# Patient Record
Sex: Female | Born: 1935 | Race: White | Hispanic: No | Smoking: Never smoker
Health system: Southern US, Community
[De-identification: ages and names within clinical notes are randomized; demographics above are authoritative.]

## PROBLEM LIST (undated history)

## (undated) DIAGNOSIS — D509 Iron deficiency anemia, unspecified: Secondary | ICD-10-CM

## (undated) DIAGNOSIS — K5790 Diverticulosis of intestine, part unspecified, without perforation or abscess without bleeding: Secondary | ICD-10-CM

## (undated) DIAGNOSIS — A4902 Methicillin resistant Staphylococcus aureus infection, unspecified site: Secondary | ICD-10-CM

## (undated) DIAGNOSIS — I2510 Atherosclerotic heart disease of native coronary artery without angina pectoris: Secondary | ICD-10-CM

## (undated) DIAGNOSIS — E039 Hypothyroidism, unspecified: Secondary | ICD-10-CM

## (undated) DIAGNOSIS — I10 Essential (primary) hypertension: Secondary | ICD-10-CM

## (undated) DIAGNOSIS — K5190 Ulcerative colitis, unspecified, without complications: Secondary | ICD-10-CM

## (undated) DIAGNOSIS — I495 Sick sinus syndrome: Secondary | ICD-10-CM

## (undated) DIAGNOSIS — I4589 Other specified conduction disorders: Secondary | ICD-10-CM

## (undated) DIAGNOSIS — E785 Hyperlipidemia, unspecified: Secondary | ICD-10-CM

## (undated) HISTORY — DX: Iron deficiency anemia, unspecified: D50.9

## (undated) HISTORY — DX: Methicillin resistant Staphylococcus aureus infection, unspecified site: A49.02

## (undated) HISTORY — DX: Diverticulosis of intestine, part unspecified, without perforation or abscess without bleeding: K57.90

## (undated) HISTORY — DX: Sick sinus syndrome: I49.5

## (undated) HISTORY — DX: Hyperlipidemia, unspecified: E78.5

## (undated) HISTORY — DX: Other specified conduction disorders: I45.89

## (undated) HISTORY — DX: Ulcerative colitis, unspecified, without complications: K51.90

## (undated) HISTORY — DX: Atherosclerotic heart disease of native coronary artery without angina pectoris: I25.10

## (undated) HISTORY — PX: THYROIDECTOMY, PARTIAL: SHX18

## (undated) HISTORY — DX: Essential (primary) hypertension: I10

## (undated) HISTORY — DX: Hypothyroidism, unspecified: E03.9

---

## 2004-02-04 HISTORY — PX: CARDIAC CATHETERIZATION: SHX172

## 2004-02-05 ENCOUNTER — Inpatient Hospital Stay (HOSPITAL_COMMUNITY): Admission: RE | Admit: 2004-02-05 | Discharge: 2004-02-07 | Payer: Self-pay | Admitting: Internal Medicine

## 2004-02-06 HISTORY — PX: PACEMAKER INSERTION: SHX728

## 2004-04-14 ENCOUNTER — Ambulatory Visit: Payer: Self-pay | Admitting: Family Medicine

## 2004-05-20 ENCOUNTER — Ambulatory Visit: Payer: Self-pay | Admitting: Internal Medicine

## 2004-07-21 ENCOUNTER — Ambulatory Visit: Payer: Self-pay | Admitting: Family Medicine

## 2004-10-20 ENCOUNTER — Ambulatory Visit: Payer: Self-pay | Admitting: Family Medicine

## 2004-11-17 ENCOUNTER — Ambulatory Visit: Payer: Self-pay | Admitting: Internal Medicine

## 2004-12-01 ENCOUNTER — Ambulatory Visit: Payer: Self-pay | Admitting: Family Medicine

## 2005-03-30 ENCOUNTER — Ambulatory Visit: Payer: Self-pay | Admitting: Family Medicine

## 2005-06-03 ENCOUNTER — Ambulatory Visit: Payer: Self-pay

## 2005-08-10 ENCOUNTER — Ambulatory Visit: Payer: Self-pay | Admitting: Family Medicine

## 2005-09-06 ENCOUNTER — Ambulatory Visit: Payer: Self-pay | Admitting: Internal Medicine

## 2005-12-06 ENCOUNTER — Ambulatory Visit: Payer: Self-pay | Admitting: Internal Medicine

## 2006-02-08 ENCOUNTER — Ambulatory Visit: Payer: Self-pay | Admitting: Family Medicine

## 2006-05-03 ENCOUNTER — Ambulatory Visit: Payer: Self-pay | Admitting: Internal Medicine

## 2006-05-26 ENCOUNTER — Ambulatory Visit: Payer: Self-pay

## 2006-06-21 ENCOUNTER — Ambulatory Visit: Payer: Self-pay | Admitting: Family Medicine

## 2006-06-28 ENCOUNTER — Ambulatory Visit: Payer: Self-pay | Admitting: Internal Medicine

## 2006-07-18 ENCOUNTER — Ambulatory Visit: Payer: Self-pay | Admitting: Family Medicine

## 2006-08-23 ENCOUNTER — Ambulatory Visit: Payer: Self-pay | Admitting: Internal Medicine

## 2006-09-13 ENCOUNTER — Ambulatory Visit: Payer: Self-pay | Admitting: Family Medicine

## 2006-10-18 ENCOUNTER — Ambulatory Visit: Payer: Self-pay | Admitting: Internal Medicine

## 2006-10-27 ENCOUNTER — Ambulatory Visit: Payer: Self-pay | Admitting: Family Medicine

## 2006-11-29 ENCOUNTER — Ambulatory Visit: Payer: Self-pay | Admitting: Family Medicine

## 2006-12-13 ENCOUNTER — Ambulatory Visit: Payer: Self-pay | Admitting: Internal Medicine

## 2007-01-10 ENCOUNTER — Ambulatory Visit: Payer: Self-pay | Admitting: Internal Medicine

## 2007-02-16 ENCOUNTER — Ambulatory Visit: Payer: Self-pay

## 2007-04-04 ENCOUNTER — Ambulatory Visit: Payer: Self-pay | Admitting: Internal Medicine

## 2007-04-06 ENCOUNTER — Encounter (HOSPITAL_BASED_OUTPATIENT_CLINIC_OR_DEPARTMENT_OTHER): Admission: RE | Admit: 2007-04-06 | Discharge: 2007-04-24 | Payer: Self-pay | Admitting: Surgery

## 2007-04-12 ENCOUNTER — Inpatient Hospital Stay (HOSPITAL_COMMUNITY): Admission: AD | Admit: 2007-04-12 | Discharge: 2007-04-17 | Payer: Self-pay | Admitting: Internal Medicine

## 2007-04-12 ENCOUNTER — Ambulatory Visit: Payer: Self-pay | Admitting: Infectious Diseases

## 2007-04-12 ENCOUNTER — Ambulatory Visit: Payer: Self-pay | Admitting: Internal Medicine

## 2007-04-12 ENCOUNTER — Ambulatory Visit: Payer: Self-pay | Admitting: Cardiology

## 2007-04-13 ENCOUNTER — Encounter: Payer: Self-pay | Admitting: Internal Medicine

## 2007-04-14 ENCOUNTER — Encounter: Payer: Self-pay | Admitting: Cardiology

## 2007-05-02 ENCOUNTER — Ambulatory Visit: Payer: Self-pay | Admitting: Internal Medicine

## 2007-05-18 ENCOUNTER — Ambulatory Visit: Payer: Self-pay | Admitting: Internal Medicine

## 2007-05-30 ENCOUNTER — Ambulatory Visit: Payer: Self-pay | Admitting: Internal Medicine

## 2007-06-27 ENCOUNTER — Ambulatory Visit: Payer: Self-pay | Admitting: Internal Medicine

## 2007-10-17 ENCOUNTER — Ambulatory Visit: Payer: Self-pay | Admitting: Internal Medicine

## 2008-01-16 ENCOUNTER — Ambulatory Visit: Payer: Self-pay | Admitting: Internal Medicine

## 2008-01-30 ENCOUNTER — Ambulatory Visit: Payer: Self-pay | Admitting: Cardiology

## 2008-02-13 ENCOUNTER — Ambulatory Visit: Payer: Self-pay | Admitting: Internal Medicine

## 2008-02-13 ENCOUNTER — Ambulatory Visit: Payer: Self-pay | Admitting: Cardiology

## 2008-02-13 LAB — CONVERTED CEMR LAB
Basophils Absolute: 0 10*3/uL (ref 0.0–0.1)
CO2: 29 meq/L (ref 19–32)
Calcium: 9.7 mg/dL (ref 8.4–10.5)
Creatinine, Ser: 1.1 mg/dL (ref 0.4–1.2)
Eosinophils Absolute: 0.2 10*3/uL (ref 0.0–0.7)
Glucose, Bld: 100 mg/dL — ABNORMAL HIGH (ref 70–99)
Neutrophils Relative %: 67.9 % (ref 43.0–77.0)
RDW: 12.7 % (ref 11.5–14.6)
Sodium: 142 meq/L (ref 135–145)
WBC: 5.9 10*3/uL (ref 4.5–10.5)

## 2008-02-15 ENCOUNTER — Inpatient Hospital Stay (HOSPITAL_BASED_OUTPATIENT_CLINIC_OR_DEPARTMENT_OTHER): Admission: RE | Admit: 2008-02-15 | Discharge: 2008-02-15 | Payer: Self-pay | Admitting: Cardiology

## 2008-02-15 ENCOUNTER — Ambulatory Visit: Payer: Self-pay | Admitting: Cardiology

## 2008-03-04 ENCOUNTER — Ambulatory Visit: Payer: Self-pay | Admitting: Cardiology

## 2008-04-11 ENCOUNTER — Ambulatory Visit: Payer: Self-pay | Admitting: Cardiology

## 2008-04-16 ENCOUNTER — Ambulatory Visit: Payer: Self-pay | Admitting: Internal Medicine

## 2008-05-21 DIAGNOSIS — I10 Essential (primary) hypertension: Secondary | ICD-10-CM

## 2008-05-21 DIAGNOSIS — D509 Iron deficiency anemia, unspecified: Secondary | ICD-10-CM | POA: Insufficient documentation

## 2008-05-21 DIAGNOSIS — I495 Sick sinus syndrome: Secondary | ICD-10-CM

## 2008-05-21 DIAGNOSIS — Z950 Presence of cardiac pacemaker: Secondary | ICD-10-CM | POA: Insufficient documentation

## 2008-05-21 DIAGNOSIS — E039 Hypothyroidism, unspecified: Secondary | ICD-10-CM | POA: Insufficient documentation

## 2008-05-21 DIAGNOSIS — I2510 Atherosclerotic heart disease of native coronary artery without angina pectoris: Secondary | ICD-10-CM

## 2008-05-21 DIAGNOSIS — A4902 Methicillin resistant Staphylococcus aureus infection, unspecified site: Secondary | ICD-10-CM | POA: Insufficient documentation

## 2008-05-21 DIAGNOSIS — E785 Hyperlipidemia, unspecified: Secondary | ICD-10-CM

## 2008-05-22 ENCOUNTER — Ambulatory Visit: Payer: Self-pay | Admitting: Cardiology

## 2008-08-12 ENCOUNTER — Encounter: Payer: Self-pay | Admitting: Internal Medicine

## 2008-08-13 ENCOUNTER — Ambulatory Visit: Payer: Self-pay | Admitting: Internal Medicine

## 2008-10-01 ENCOUNTER — Ambulatory Visit: Payer: Self-pay | Admitting: Cardiology

## 2008-11-18 ENCOUNTER — Ambulatory Visit: Payer: Self-pay | Admitting: Internal Medicine

## 2009-01-14 IMAGING — CR DG CHEST 1V PORT
1 series · 1 of 1 positions shown · non-contrast
Comparison: 02/07/04.

CLINICAL DATA: Question pacemaker.  Endocarditis.  
 PORTABLE CHEST ([DATE] HOURS):

[view not recorded]
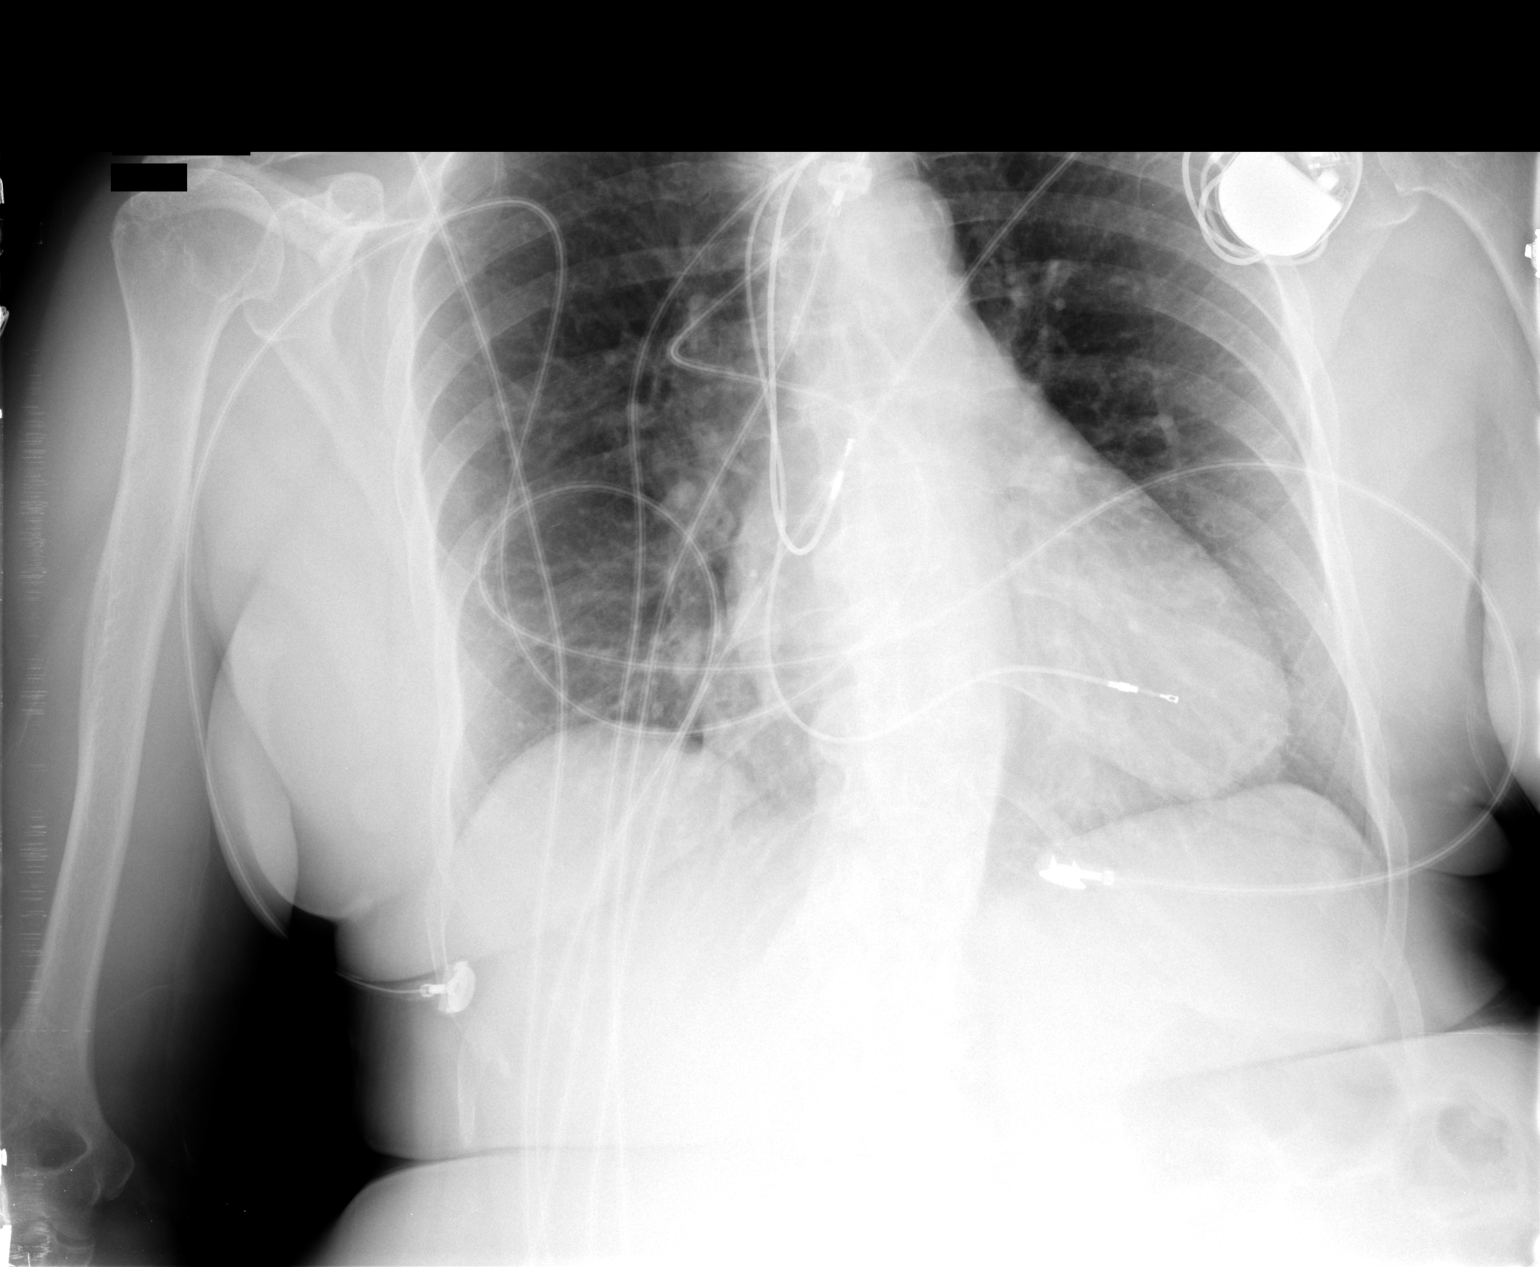

[1 of 1 positions shown; findings below may reference images not displayed]

FINDINGS: Dual lead pacemaker is unchanged from the prior study.  The heart is mildly enlarged.  There is no heart failure, infiltrate, or effusion.
IMPRESSION: No active cardiopulmonary disease.

## 2009-02-17 ENCOUNTER — Ambulatory Visit: Payer: Self-pay | Admitting: Internal Medicine

## 2009-02-20 ENCOUNTER — Encounter (INDEPENDENT_AMBULATORY_CARE_PROVIDER_SITE_OTHER): Payer: Self-pay | Admitting: *Deleted

## 2009-03-05 ENCOUNTER — Ambulatory Visit: Payer: Self-pay

## 2009-03-05 ENCOUNTER — Encounter: Payer: Self-pay | Admitting: Internal Medicine

## 2009-05-19 ENCOUNTER — Ambulatory Visit: Payer: Self-pay | Admitting: Internal Medicine

## 2009-08-18 ENCOUNTER — Ambulatory Visit: Payer: Self-pay | Admitting: Internal Medicine

## 2009-08-26 ENCOUNTER — Ambulatory Visit: Payer: Self-pay | Admitting: Internal Medicine

## 2009-11-17 ENCOUNTER — Encounter: Payer: Self-pay | Admitting: Internal Medicine

## 2009-11-28 ENCOUNTER — Ambulatory Visit: Payer: Self-pay | Admitting: Internal Medicine

## 2010-02-13 ENCOUNTER — Ambulatory Visit: Payer: Self-pay | Admitting: Internal Medicine

## 2010-02-16 ENCOUNTER — Encounter: Payer: Self-pay | Admitting: Internal Medicine

## 2010-05-18 ENCOUNTER — Ambulatory Visit: Payer: Self-pay | Admitting: Internal Medicine

## 2010-07-02 ENCOUNTER — Encounter (INDEPENDENT_AMBULATORY_CARE_PROVIDER_SITE_OTHER): Payer: Self-pay | Admitting: *Deleted

## 2010-07-09 NOTE — Cardiovascular Report (Signed)
Summary: TTM   TTM   Imported By: Sallee Provencal 12/15/2009 14:47:29  _____________________________________________________________________  External Attachment:    Type:   Image     Comment:   External Document

## 2010-07-09 NOTE — Cardiovascular Report (Signed)
Summary: TTM   TTM   Imported By: Sallee Provencal 06/27/2009 14:57:07  _____________________________________________________________________  External Attachment:    Type:   Image     Comment:   External Document

## 2010-07-09 NOTE — Assessment & Plan Note (Signed)
Summary: per check out/sf   Primary Provider:  Edrick Oh, MD  CC:  pt has concerns over new medication given for cholesterol.  Vimovo rx.  Pt does not want to take medication given her hx of ulcerative colitis.  Needs input on whether she should take this medication or not.Marland Kitchen  History of Present Illness: Ms. Paula Walls is seen following pacemaker implantation for sinus node dysfunction.  She has no intrinsic atrial rhythm.   She has normal left ventricular function; by echo in 2008 and catheterization 2009 demonstrated moderate diffuse disease also with normal ejection fraction.  Her major complaint is cramping in her thighs bilaterally periodicity going on and off for more than a year. Discontinuation of her status was associated with some but incomplete relief.  She was given by Dr. Edrick Oh and prescription for Naprosyn omeprazole combination.   Current Medications (verified): 1)  Diovan Hct 160-12.5 Mg Tabs (Valsartan-Hydrochlorothiazide) .Marland Kitchen.. 1 Tablet By Mouth Once A Day 2)  Levoxyl 100 Mcg Tabs (Levothyroxine Sodium) .Marland Kitchen.. 1 Once Daily 3)  Metoprolol Succinate 50 Mg Xr24h-Tab (Metoprolol Succinate) .... Take One Two Times A Day 4)  Folic Acid 1 Mg Tabs (Folic Acid) .Marland Kitchen.. 1 Once Daily 5)  Sulfasalazine 500 Mg Tabs (Sulfasalazine) .Marland Kitchen.. 1 Once Daily  Allergies (verified): 1)  ! Flagyl  Past History:  Past Medical History: Last updated: 05/21/2008 SINOATRIAL NODE DYSFUNCTION (ICD-427.81) PACEMAKER, PERMANENT (ICD-V45.01) MRSA (ICD-041.12) UNSPECIFIED IRON DEFICIENCY ANEMIA (ICD-280.9) UNSPECIFIED HYPOTHYROIDISM (ICD-244.9) HYPERTENSION, UNSPECIFIED (ICD-401.9) HYPERLIPIDEMIA-MIXED (ICD-272.4) CAD, NATIVE VESSEL (ICD-414.01)  Past Surgical History: Last updated: 05/21/2008 Cardiac Catheterization 02/04/2004 Permanent pacemaker St. Jude 02/06/2004 Partial thyroidectomy   Family History: Last updated: 05/21/2008 Remakable for father who died at 68, mother at 46, and a brother at 64.  Mother had a history of transient ischemic attacks and possible heart attack.  Father she feels that it was possibly a hear attack, although both of their history is questionable, as they did not see physicians.    Social History: Last updated: 05/21/2008 Widowed  Tobacco Use - No.  Part Time -works in [employer] Signs:  Patient profile:   77 year old female Height:      62 inches Weight:      126 pounds BMI:     23.13 Pulse rate:   60 / minute Pulse rhythm:   regular BP sitting:   134 / 75  (left arm)  Vitals Entered By: Doug Sou CMA (August 26, 2009 11:42 AM)  Physical Exam  General:  The patient was alert and oriented in no acute distress. HEENT Normal.  Neck veins were flat, carotids were brisk.  Lungs were clear.  Heart sounds were regular without murmurs or gallops.  Abdomen was soft with active bowel sounds. There is no clubbing cyanosis or edema. Skin Warm and dry    PPM Specifications Following MD:  Virl Axe, MD     PPM Vendor:  St Jude     PPM Model Number:  [id number]     PPM Serial Number:  [id number] DOI:  02/06/2004     PPM Implanting MD:  Virl Axe, MD  Lead 1    Location: RA     DOI: 02/06/2004     Model #: [id number]     Serial #: [id number]     Status: active Lead 2    Location: RV     DOI: 02/06/2004     Model #: [id number]     Serial #: [id number]  Status: active  Magnet Response Rate:  BOL 98.6 ERI  86.3  Indications:  Sinus node dysfunction  Explantation Comments:  TTM's with [employer]  PPM Follow Up Remote Check?  No Battery Voltage:  2.76 V     Battery Est. Longevity:  5.75 years     Pacer Dependent:  Yes       PPM Device Measurements Atrium  Impedance: 505 ohms, Threshold: 0.5 V at 0.4 msec Right Ventricle  Amplitude: 16.6 mV, Impedance: 400 ohms, Threshold: 0.875 V at 0.4 msec  Episodes MS Episodes:  0     Percent Mode Switch:  0     Coumadin:  No Atrial Pacing:  100%     Ventricular Pacing:  <1%  Parameters Mode:  DDDR     Lower  Rate Limit:  60     Upper Rate Limit:  110 Paced AV Delay:  250     Sensed AV Delay:  225 Next Cardiology Appt Due:  02/05/2010 Tech Comments:  No parameter changes.   Device function normal.  ROV 6 months clinic. Alma Friendly, LPN  August 27, 9242 62:86 AM   Impression & Recommendations:  Problem # 1:  SINOATRIAL NODE DYSFUNCTION (ICD-427.81) 100% paced via her device Her updated medication list for this problem includes:    Metoprolol Succinate 50 Mg Xr24h-tab (Metoprolol succinate) .Marland Kitchen... Take one two times a day  Problem # 2:  PACEMAKER, PERMANENT (ICD-V45.01) Device parameters and data were reviewed and no changes were made  Problem # 3:  HYPERLIPIDEMIA-MIXED (ICD-272.4) she is currently not on statin therapy. I suggested that she try red yeast rice. Also as relates to these leg cramps I wonder whether this could be an unusual manifestation of polymyalgia rheumatica. She is to see Dr. Edrick Oh tomorrow. I've given her a note asking that question.it would certainly be atypical The following medications were removed from the medication list:    Lipitor 20 Mg Tabs (Atorvastatin calcium) .Marland Kitchen... Take one tablet by mouth daily.

## 2010-07-09 NOTE — Cardiovascular Report (Signed)
Summary: Office Visit   Office Visit   Imported By: Sallee Provencal 08/28/2009 10:09:17  _____________________________________________________________________  External Attachment:    Type:   Image     Comment:   External Document

## 2010-07-09 NOTE — Cardiovascular Report (Signed)
Summary: TTM   TTM   Imported By: Sallee Provencal 06/05/2010 11:43:23  _____________________________________________________________________  External Attachment:    Type:   Image     Comment:   External Document

## 2010-07-09 NOTE — Cardiovascular Report (Signed)
Summary: TTM   TTM   Imported By: Sallee Provencal 03/18/2010 16:09:50  _____________________________________________________________________  External Attachment:    Type:   Image     Comment:   External Document

## 2010-07-09 NOTE — Cardiovascular Report (Signed)
Summary: TTM   TTM   Imported By: Sallee Provencal 08/27/2009 16:15:20  _____________________________________________________________________  External Attachment:    Type:   Image     Comment:   External Document

## 2010-07-09 NOTE — Letter (Signed)
Summary: Device-Delinquent Check  [facility] [address] [place]   [place], [place] [address]   Phone: [phone]  Fax: [phone]     July 02, 2010 MRN: [id number]   Paula Walls [address] [place], [place]  [address]   Dear Ms. Huynh,  According to our records, you have not had your implanted device checked in the recommended period of time.  We are unable to determine appropriate device function without checking your device on a regular basis.  Please call our office to schedule an appointment, with Dr Caryl Comes,  as soon as possible.  If you are having your device checked by another physician, please call us so that we may update our records.  Thank you,  Gasper Sells, EMT  July 02, 2010 12:48 PM  [facility] certified

## 2010-07-23 ENCOUNTER — Encounter (INDEPENDENT_AMBULATORY_CARE_PROVIDER_SITE_OTHER): Payer: Medicare Other | Admitting: Internal Medicine

## 2010-07-23 ENCOUNTER — Encounter: Payer: Self-pay | Admitting: Internal Medicine

## 2010-07-23 DIAGNOSIS — I10 Essential (primary) hypertension: Secondary | ICD-10-CM

## 2010-07-23 DIAGNOSIS — I498 Other specified cardiac arrhythmias: Secondary | ICD-10-CM

## 2010-07-23 DIAGNOSIS — Z950 Presence of cardiac pacemaker: Secondary | ICD-10-CM

## 2010-07-24 ENCOUNTER — Encounter: Payer: Self-pay | Admitting: Internal Medicine

## 2010-08-04 NOTE — Cardiovascular Report (Signed)
Summary: Certified Letter Signed - Patient (not doing f/u, pt put on reca  Certified Letter Signed - Patient (not doing f/u, pt put on recall list)   Imported By: Ranell Patrick 07/29/2010 16:07:55  _____________________________________________________________________  External Attachment:    Type:   Image     Comment:   External Document

## 2010-08-04 NOTE — Cardiovascular Report (Signed)
Summary: Office Visit   Office Visit   Imported By: Sallee Provencal 07/29/2010 16:17:12  _____________________________________________________________________  External Attachment:    Type:   Image     Comment:   External Document

## 2010-08-04 NOTE — Assessment & Plan Note (Signed)
Summary: pacer;phys ck/mt   Visit Type:  pacer check Primary Provider:  Edrick Oh, MD   History of Present Illness: Paula Walls is seen following pacemaker implantation for sinus node dysfunction.  She has no intrinsic atrial rhythm.   She has normal left ventricular function; by echo in 2008 and catheterization 2009 demonstrated moderate diffuse disease also with normal ejection fraction.  She recently had a problem with a sub-conjunctiva hemorrhage which was quite disconcerting The patient denies SOB, chest pain, edema or palpitations      Current Medications (verified): 1)  Diovan Hct 160-12.5 Mg Tabs (Valsartan-Hydrochlorothiazide) .Marland Kitchen.. 1 Tablet By Mouth Once A Day 2)  Levoxyl 100 Mcg Tabs (Levothyroxine Sodium) .Marland Kitchen.. 1 Once Daily 3)  Metoprolol Succinate 50 Mg Xr24h-Tab (Metoprolol Succinate) .... Take 1 Tablet By Mouth Once A Day 4)  Folic Acid 1 Mg Tabs (Folic Acid) .Marland Kitchen.. 1 Once Daily  Allergies: 1)  ! Flagyl  Past History:  Past Medical History: Last updated: 05/21/2008 SINOATRIAL NODE DYSFUNCTION (ICD-427.81) PACEMAKER, PERMANENT (ICD-V45.01) MRSA (ICD-041.12) UNSPECIFIED IRON DEFICIENCY ANEMIA (ICD-280.9) UNSPECIFIED HYPOTHYROIDISM (ICD-244.9) HYPERTENSION, UNSPECIFIED (ICD-401.9) HYPERLIPIDEMIA-MIXED (ICD-272.4) CAD, NATIVE VESSEL (ICD-414.01)  Family History: Reviewed history from 05/21/2008 and no changes required. Remakable for father who died at 4, mother at 35, and a brother at 68. Mother had a history of transient ischemic attacks and possible heart attack.  Father she feels that it was possibly a hear attack, although both of their history is questionable, as they did not see physicians.    Social History: Reviewed history from 05/21/2008 and no changes required. Widowed  Tobacco Use - No.  Part Time -works in nursing home  Vital Signs:  Patient profile:   77 year old female Height:      62 inches Weight:      126.25 pounds BMI:     23.17 Pulse  rate:   72 / minute Pulse rhythm:   regular Resp:     18 per minute BP sitting:   190 / 82  (right arm) Cuff size:   large  Vitals Entered By: Sidney Ace (July 23, 2010 11:12 AM)  Physical Exam  General:  The patient was alert and oriented in no acute distress. HEENT Normal.  Neck veins were flat, carotids were brisk.  Lungs were clear.  Heart sounds were regular without murmurs or gallops.  Abdomen was soft with active bowel sounds. There is no clubbing cyanosis or edema. Skin Warm and dry    PPM Specifications Following MD:  Virl Axe, MD     PPM Vendor:  St Jude     PPM Model Number:  [id number]     PPM Serial Number:  [id number] PPM DOI:  02/06/2004     PPM Implanting MD:  Virl Axe, MD  Lead 1    Location: RA     DOI: 02/06/2004     Model #: [id number]     Serial #: [id number]     Status: active Lead 2    Location: RV     DOI: 02/06/2004     Model #: 8676P     Serial #: [id number]     Status: active  Magnet Response Rate:  BOL 98.6 ERI  86.3  Indications:  Sinus node dysfunction  Explantation Comments:  TTM's with [employer]  PPM Follow Up Pacer Dependent:  Yes      Episodes Coumadin:  No  Parameters Mode:  DDDR     Lower Rate Limit:  60  Upper Rate Limit:  110 Paced AV Delay:  250     Sensed AV Delay:  225  Impression & Recommendations:  Problem # 1:  SINOATRIAL NODE DYSFUNCTION (ICD-427.81) She is 100% atrially paced; heart excursion is good Her updated medication list for this problem includes:    Metoprolol Succinate 100 Mg Xr24h-tab (Metoprolol succinate) .Marland Kitchen... 1 once daily  Problem # 2:  PACEMAKER, PERMANENT (ICD-V45.01) Device parameters and data were reviewed and no changes were made  Problem # 3:  HYPERTENSION, UNSPECIFIED (ICD-401.9) Her blood pressures through the roof. We will double her Diovan and double her metoprolol. I last her to follow up with Dr. Edrick Oh in 2 weeks' time for a metabolic profile and repeat blood pressure check Her updated  medication list for this problem includes:    Diovan Hct 160-12.5 Mg Tabs (Valsartan-hydrochlorothiazide) .Marland Kitchen... 1 tablet by mouth once a day    Metoprolol Succinate 100 Mg Xr24h-tab (Metoprolol succinate) .Marland Kitchen... 1 once daily    Diovan Hct 320-25 Mg Tabs (Valsartan-hydrochlorothiazide) .Marland Kitchen... 1 once daily  Patient Instructions: 1)  Your physician recommends that you return for lab work in:2 [place] TO PT 2)  Your physician has recommended you make the following change in your medication:IN CREASE DIOVAN TO 320/25 MG once daily  3)  METOPROLOL TO 100 MG once daily   4)  Your physician wants you to follow-up in:  [place] will receive a reminder letter in the mail two months in advance. If you don't receive a letter, please call our office to schedule the follow-up appointment. Prescriptions: METOPROLOL SUCCINATE 100 MG XR24H-TAB (METOPROLOL SUCCINATE) 1 once daily  #30 x 11   Entered by:   Devra Dopp, LPN   Authorized by:   Nikki Dom, MD, [facility]   Signed by:   Devra Dopp, LPN on 81/19/1478   Method used:   Electronically to        [facility] (retail)       [place] [address]       [place], [place]  [address]       Ph: [phone]       Fax: [phone]   RxID:   [id number] DIOVAN HCT 320-25 MG TABS (VALSARTAN-HYDROCHLOROTHIAZIDE) 1 once daily  #30 x 11   Entered by:   Devra Dopp, LPN   Authorized by:   Nikki Dom, MD, [facility]   Signed by:   Devra Dopp, LPN on 34/74/2595   Method used:   Electronically to        [facility] (retail)       [place] [address]       [place], [place]  [address]       Ph: [phone]       Fax: [phone]   RxID:   [id number]

## 2010-08-06 ENCOUNTER — Encounter: Payer: Self-pay | Admitting: Internal Medicine

## 2010-10-20 NOTE — Assessment & Plan Note (Signed)
[facility] OFFICE NOTE   PATRIZIA, PAULE                       MRN:          [id number]  DATE:01/30/2008                            DOB:          1932/04/30    This is a repeat dictation.  It has been previously dictated, I believe,  on February 10, 2008.   REFERRING PHYSICIAN:  Margarita Rana, MD, in [place].   CHIEF COMPLAINT:  Exertional tightness.   HISTORY OF PRESENT ILLNESS:  Ms. Tafolla is a 77 year old female well-  known to [place].  She has previously undergone catheterization in 2005.  At  that time, she had a fairly high-grade bifurcational stenosis of the LAD  diagonal.  We elected to treat her medically at that time.  She also had  chronotropic incompetence, underwent pacer implantation with, and is  paced atrially.  She is followed by Dr. Caryl Comes for this.  In the last  couple of months, she has developed some increasing exertional  intolerance.  She has had some shortness of breath at moderate levels of  exercise that developed chest tightness.  Since I last saw her, she  apparently developed methicillin-resistant Staph infection on her left  leg.  TEE was negative.  She was treated with intravenous antibiotics.  She was also found to be anemic with slight elevation in her creatinine,  creatinine eventually resolved.  Her anemia was treated.  She apparently  has have to follow up with Dr. Edrick Oh for repeat hemoglobins.  She now  presents for reevaluation.  She does note that she has to stop, she gets  cramps in her hands.   PAST MEDICAL HISTORY:  Remarkable for;  1. Coronary artery disease by catheterization in 2005.  2. Chronotropic incompetence, status post pacer implantation.  3. Methicillin-resistant Staph treated.  4. Hyperlipidemia.  5. Hypertension.  6. Hypothyroidism.  7. Iron deficiency anemia.   ALLERGIES:  FLAGYL.   MEDICATIONS:  1. Diovan HCT 160/12.5 daily.  2. Levoxyl 100  mg daily.  3. Metoprolol ER 50 mg p.o. b.i.d.  4. Lipitor 20 mg daily.   SOCIAL HISTORY:  The patient is widowed.  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  She has 2 children.   FAMILY HISTORY:  Remarkable for a father who died at 94 and a mother of  90 of old age.  She is a nonsmoker   REVIEW OF SYSTEMS:  She has not had obvious GI bleed.  She has had a  followup evaluation.  She has not had diarrhea or major GI symptoms.  She denies hemoptysis.  Her weight has been stable.  The review of  systems is, otherwise, negative.   PHYSICAL EXAMINATION:  GENERAL:  She is alert and oriented female in no  acute distress.  VITAL SIGNS:  Weight is 138; blood pressure 124/70, equal both arms; and  pulse is 60 and regular.  NECK:  Jugular veins are not distended.  Neck is supple.  Carotid  upstrokes are brisk.  LUNGS:  Lung fields are clear to auscultation and percussion.  The PMI  is nondisplaced.  There is normal first and second heart sound without  obvious murmur, rub, or gallop.  ABDOMEN:  Soft without hepatosplenomegaly.  Pacer site is intact.  EXTREMITIES:  Femoral pulses are intact.  Distal pulses are intact.  NEUROLOGIC:  Nonfocal.  Cranial nerves are intact.   EKG reveals atrial pacing, otherwise, unremarkable.   IMPRESSION:  1. Known coronary artery disease with moderately high-grade      bifurcational disease treated medically with increased      symptomatology.  2. Hyperlipidemia.  3. Hypothyroidism.  4. Status post pacer implantation.  5. History of chronotropic incompetence.  6. Hypertension.  7. Iron deficiency anemia.   PLAN:  I have discussed the options with the patient.  We sent her home  from the office to think about it.  My leaning would be in the direction  of repeat catheterization given her previous high-grade disease.  Risks,  benefits, and alternatives have been discussed with the patient, and she  will get back to [place] soon regarding the  findings.      Loretha Brasil. Lia Foyer, MD, [facility]  Electronically Signed    TDS/MedQ  DD: 02/15/2008  DT: 02/15/2008  Job #: [id number]

## 2010-10-20 NOTE — Assessment & Plan Note (Signed)
[facility]                         ELECTROPHYSIOLOGY OFFICE NOTE   TICIA, VIRGO                       MRN:          [id number]  DATE:08/13/2008                            DOB:          11-15-1931    Ms. Shimamoto is seen following pacemaker implantation for sinus node  dysfunction.  She has no intrinsic atrial rhythm.   She has had no complaints of chest pain or shortness of breath.  She has  had resolution of problems related to diverticulitis and possible  colitis and seeing Dr. Laural Golden.   On examination today, her blood pressure is mildly elevated at 160/60  with a pulse of 61, her weight was 120 which is up 6 pounds.   Her blood pressure medications include Diovan HCT 160/12.5, Levoxyl,  metoprolol 50 b.i.d., and Lipitor.   Physical examination addition was notable for normal heart sounds that  were regular without murmur.  The lungs were clear and the extremities  had no edema.   Interrogation of her St. Jude pulse generator demonstrated no intrinsic  atrial rhythm with impedance of 497, threshold 0.5 to 0.4.  The R-wave  was 14.7, impedance of 400, threshold 0.6 to 5.4.  She is 100% atrial  paced.   IMPRESSION:  1. Sinus node dysfunction with sinus node arrest.  2. Status post pacer for the above.  3. Hypertension.  4. Ischemic heart disease, currently stable.   Ms. Dinius is doing quite well and we will be concerned about her blood  pressure.  I have asked her she follow up with Dr. Edrick Oh about that to  see if it does not come down some.   We will see her again in 6 months' time.     Deboraha Sprang, MD, [facility]  Electronically Signed    SCK/MedQ  DD: 08/13/2008  DT: 08/14/2008  Job #: [id number]   cc:   Margarita Rana, M.D.

## 2010-10-20 NOTE — Discharge Summary (Signed)
NAMESTEPHAIE, Paula Walls                ACCOUNT NO.:  [id number]   MEDICAL RECORD NO.:  [id number]          PATIENT TYPE:  INP   LOCATION:  2002                         FACILITY:  [facility]   PHYSICIAN:  Deboraha Sprang, MD, FACCDATE OF BIRTH:  August 04, 1931   DATE OF ADMISSION:  04/12/2007  DATE OF DISCHARGE:  04/17/2007                               DISCHARGE SUMMARY   ALLERGIES:  This patient has no known drug allergies.   Dictation greater than 45 minutes.   FINAL DIAGNOSES:  1. Admitted with fatigue/weakness/chills/hypotension/anemia and her      ESR was 55.  2. Recent (3-4 week) history of persistent infection - carbuncle left      upper thigh - cultures methicillin-resistant Staphylococus aureus.      a.     Required I+D three different times and antibiotic therapy.  3. Admission hemoglobin was 10.3.  4. Admitted with possible sepsis.  5. Blood culture x2 negative x5 days (but the patient was on Bactrim).  6. Hemoccult of stool was negative this admission.  7. Transesophageal echocardiogram:  No vegetations on pacer leads      noted.  8. Patient treated with antibiotics.      a.     Vancomycin IV x6 days.  9. Discharging on linezolid 600 mg b.i.d. from November 11 to November      20 a 10-day course.  10.Dysphagia with solid foods to be followed up by Dr. Laural Golden.   SECONDARY DIAGNOSES:  1. History of coronary artery disease status post coronary      catheterization August 2005.  Ejection fraction preserved. LAD had      a 30% and 70-80% stenoses at the bifurcation with the first      diagonal and diagonal itself, 80% left circumflex, 20% right      coronary artery, no significant coronary artery disease.  Medical      therapy recommended.  2. History of sinus bradycardia/chronotropic incompetence status post      implant St. Jude Identity 80's XL dual chamber pacemaker February 06, 2004, Dr. Virl Axe.  3. Dyslipidemia.  4. Hypertension.  5. Family history of coronary  artery disease.  6. Treated hypothyroidism.   PROCEDURES:  1. 2-D echocardiogram April 13, 2007, ejection fraction 55-60%, no      left ventricular regional wall motion abnormalities, wall thickness      is normal.  Features are consistent with mild diastolic      dysfunction, mitral valve structure was normal, no mitral stenosis,      mild mitral regurgitation, appearance of pacing wire in the right      ventricle.  2. Transesophageal echocardiogram April 14, 2007:  Normal left      ventricular function.  No pericardial effusion.  No vegetations      identified. No IAFC by color Doppler, mild mitral regurgitation,      mild tricuspid regurgitation.   BRIEF HISTORY:  Paula Walls is a 77 year old female.  She has a history  of chronotropic incompetence.  She had a St. Jude pacemaker implanted in  September 2005.   She had a carbuncle in the left upper thigh, and she underwent incision  and drainage three separate times.  It did not spontaneously resolve,  and the infection seemed to be spreading.  She was started on antibiotic  therapy.   She continued to complain of fatigue and general malaise.  She had some  chills not particular having fevers.  She was very weak and found to be  hypotensive.   Her daughter took her to see Dr. Caryl Comes.  He was concerned that she  possibly had a systemic infection which involved her pacemaker leads.  She was therefore admitted to [facility] November 5 for further  evaluation.  The patient has not had chest pain, but she does complain  of dysuria and cloudy urine.   The patient's blood pressure running much lower than it normally does  and with general malaise.  The patient possibly has sepsis.  We will  obtain blood cultures x2 and then start on IV vancomycin.  Her CBC will  be followed closely.  There is some thought that the patient is  dehydrated since her admission creatinine is 1.73.  We will also  schedule a 2-D echocardiogram  and a transesophageal echocardiogram.  Internal medicine consult will be obtained as well as an infectious  disease consult.   HOSPITAL COURSE:  The patient presented to the office of [place] on November 5, and she was admitted through the office to the  hospital the same day for malaise, chills, possible sepsis, hypotension.  It was found on admission laboratories that her creatinine was 1.73 and  her ESR was 55.  A 2-D echocardiogram as dictated above showed ejection  fraction 55-60% with no left ventricular wall motion abnormalities.  She  was submitted for blood cultures x2 and then started on IV vancomycin.  This has continued throughout this hospitalization for six day course so  far.  Her serum creatinine has improved dramatically after hydration,  and at the time of discharge it is 0.93.  Blood cultures taken November  5 were negative x5 days, but the patient has been on Bactrim and this  may affect the result.  Transesophageal echocardiogram on November 7  showed no evidence of vegetations, and the pacemaker was not explanted.  The patient had a hemoglobin 10.3 on admission. and hemoccult studies  and stool have been negative x2 this admission.  Will have outpatient  workup for anemia with Dr. Edrick Oh.  Her iron stores seemed to be low.  The patient also has mentioned to the staff here that she has dysphagia  with solid foods.  At times the food seems to get stuck just prior to  entering the stomach.  She simply has to wait until the resolution of a  feeling of dysphagia resolves. She says it often happens when she is  most nervous or after she has not eaten for awhile.  She will have  follow-up with Dr. Laural Golden to pursue this.  Patient has been followed  throughout this hospitalization by Dr. Ola Spurr of infectious disease  who recommends a 10-day course of linezolid 600 mg b.i.d. as an  outpatient.  The plan will be for the patient to discharge November 10.  She  will be on the antibiotic therapy twice a day.   PLAN:  At this time linezolid is prohibitively expensive for the patient  to take and so the patient will go home per infectious disease on  doxycycline 100 mg twice daily.  Also for a 10-day course.  There will  be no need for a complete blood count after one week to measure bone  marrow suppression, but we will still take blood cultures one week after  she discontinues doxycycline and two weeks after doxycycline has been  discontinued with results going to Dr. Virl Axe and also Dr. Dione Housekeeper.   DISCHARGE MEDICATION:  We are not going to have linezolid 600 mg b.i.d.  from November 11 to November 20.  The patient will discharge on the  following medication:   1. Aspirin 81 mg daily.  2. Levothyroxine 100 mcg daily.  3. Diovan 60 mg daily.  4. Metoprolol 1/2 tablet twice daily.  5. Doxycycline 100 mg b.i.d. for 10-day course.  6. Ferrous sulfate 325 mg b.i.d.  7. Lipitor 20 mg daily.  8. She will follow up with Dr. Edrick Oh in one week, and she will see      Dr. Laural Golden Wednesday, June 14, 2007, at 11:45 a.m.  She will see      Dr. Caryl Comes December 11, and this is at a time when both blood      cultures will have been taken both on November 27 and December 4.   LABORATORY STUDIES:  C. difficile assay was negative.  TSH 0.5.  Iron  22, TIBC was 230, percent saturation of iron 10, UIBC 208.  Once again  ESR was 55 on this admission.  On November 5 the patient's serum  electrolytes:  Sodium 139, potassium 5, chloride 108, carbonate 27, BUN  22, creatinine 0.91, glucose 98.  Complete blood count on admission  November 5:  Hemoglobin 10.3, hematocrit 30, white cells 6.9, platelets  202.  Complete blood count on November 6:  Hemoglobin 9.6, hematocrit  28.3, white cells 5.6, and platelets 173.  Urinalysis done this  admission was negative.      Sueanne Margarita, [place]      Deboraha Sprang, MD, [facility]  Electronically  Signed    GM/MEDQ  D:  04/17/2007  T:  04/17/2007  Job:  [id number]   cc:   Hildred Laser, M.D.  Margarita Rana, M.D.  Deboraha Sprang, MD, [facility]

## 2010-10-20 NOTE — Cardiovascular Report (Signed)
NAMETIPHANI, MELLS                ACCOUNT NO.:  [id number]   MEDICAL RECORD NO.:  [id number]          PATIENT TYPE:  OIB   LOCATION:  1962                         FACILITY:  [facility]   PHYSICIAN:  Loretha Brasil. Lia Foyer, MD, FACCDATE OF BIRTH:  1931-07-08   DATE OF PROCEDURE:  02/15/2008  DATE OF DISCHARGE:                            CARDIAC CATHETERIZATION   INDICATIONS:  Paula Walls is a very delightful 77 year old well-known to  me.  She has previously had cardiac catheterization demonstrating a  moderately high-grade stenosis of the left anterior descending artery.  She recently has had some slight increase in symptoms.  She has also had  a pacer placed for chronotropic incompetence in the past and is followed  by Dr. Caryl Comes.  Because of the increased symptoms, we elected to  recommend repeat cardiac catheterization.  On the preprocedural  laboratory studies, her CO2 was 29, BUN was 31, although the creatinine  was 1.1.  As a result, we gave her approximately 200 mL of fluid on  arrival in the catheterization laboratory and continued these.  We also  elected to do just a simple hand injection into the left ventricle.   PROCEDURE:  1. Left heart catheterization.  2. Selective coronary arteriography.  3. Selective left ventriculography.   DESCRIPTION OF PROCEDURE:  The patient was brought to the Cath Lab and  prepped and draped in usual fashion after approximately 1 hour of  intravenous fluids.  The procedure was performed from the right femoral  artery with 4-French catheters.  Four views of the left coronary  arteries were obtained and intracoronary nitroglycerin was administered.  An additional view was then obtained.  Three views of the right coronary  were obtained.  Central aortic and left ventricular pressures were  measured with pigtail and simple hand injection into the left ventricle  was performed.  There were no major complications and she was taken to  the holding area in  satisfactory clinical condition.  I then reviewed  her films in detail with the patient's daughters with the patient's  consent.  There were no major complications.   HEMODYNAMIC DATA:  1. Central aortic pressure was 159/77, mean 111.  2. Left ventricular pressure 164/14.  There was not a significant      gradient on pullback across the aortic valve after equilibration.   ANGIOGRAPHIC DATA:  1. On plain fluoroscopy, there is fairly moderate calcification of the      left anterior descending artery and mild calcification in the      circumflex and right vessel.  2. The left main is free of critical disease.  3. The LAD courses to the apex and provides one major diagonal branch.      Importantly, there is a fair amount of calcification proximally,      with several areas of modest plaquing that probably do not exceed      30-40% luminal reduction.  At the bifurcation of the major diagonal      branch, there is a focal stenosis measuring probably in the range      of about 75%  to perhaps 80% at the most.  It is relatively smooth      and calcified and overlays a small caliber diagonal that is      actually at least modest in its distribution.  The diagonal itself      has 80% ostial narrowing and divides into 2 branches.  These      branches do not appear to be more than 1.5 mm in size.  The distal      LAD demonstrates some luminal irregularity and some calcification,      but no critical stenosis.  4. The circumflex coronary artery provides a bifurcating marginal      branch.  It is also calcified but is fairly large in caliber and      free of critical disease.  5. The right coronary artery demonstrates about 20% proximal      narrowing.  It terminates as a posterior descending branch,      posterolateral branch, and a smaller posterolateral branch that      bifurcates.  The RCA has some irregularity but no significant focal      obstruction.  6. The ventriculogram was done with  just a handheld injection to      reduce contrast load.  Overall, LV function appeared preserved and      there was no obvious significant mitral regurgitation.   CONCLUSION:  1. Preserved overall left ventricular function.  2. Status post permanent pacing for chronotropic incompetence.  3. Bifurcation stenosis involving a calcified left anterior descending      artery in a small caliber but larger distribution diagonal branch.   DISPOSITION:  I have reviewed the current films and compared them to the  old films.  Based on the above findings and her class II symptoms, my  inclination would be to continue to treat her medically.  I will see her  back in the office in 2 weeks and we will reconstruct her medical  program.  The patient takes aspirin only intermittently and we will  likely place her on permanent aspirin therapy.  We may elect to do a  radionuclide imaging study and exercise testing to better identify the  status between both chronotropic incompetence and myocardial perfusion.  Nonetheless, optimal therapy at this time would probably be continued  medical treatment.      Loretha Brasil. Lia Foyer, MD, [facility]  Electronically Signed     TDS/MEDQ  D:  02/15/2008  T:  02/15/2008  Job:  [id number]   cc:   Deboraha Sprang, MD, [facility]  Margarita Rana, M.D.

## 2010-10-20 NOTE — Letter (Signed)
March 04, 2008    Margarita Rana, MD  [address]  [place], [place]  [id number]   RE:  [name], [name]  MRN:  [id number]  /  DOB:  [birth year]   Dear [name] Cancel,   I had the pleasure of seeing your nice patient, Paula Walls, in the  office today in a followup visit.  She recently underwent cardiac  catheterization previously, and I had she and her daughter return to see  me today in followup.  In general, this study looks somewhat similar to  what it was noticed previously.  She has a heavily calcified left  anterior descending artery with bifurcation and stenosis at the LAD  diagonal takeoff.  It in and of itself was fairly heavily calcified.  If  it has progressed, it is only mildly so compared to the previous study.  Since she underwent a cath, her symptoms now have improved although she  has had diarrhea for the past week.   Her current medications include Levoxyl, Diovan HCT, metoprolol ER 50 mg  b.i.d., and Lipitor 20 mg daily.   On physical today, she is alert and oriented in no distress.  The blood  pressure is 114/68, the pulse is 60.  The groin has healed.  There is no  bruit noted.   Her electrocardiogram demonstrates electronic atrial pacemaker with a  rate of 60.  No acute changes are noted.   She appears to be stable.  We talked about multiple treatment goals.  This includes increasing your exercise to a greater level currently.  There is also a goal to keep the LDL below 70.  We are going to bring  her back in about 4 weeks to do a stress test, just to see how she  performs  and to prescribe exercise prescription.  We will continue her on her  other medical regimen at the present time.  I spoke with she and her  daughter for quite sometime this morning, and we are pleased that she is  doing reasonably well.  If her diarrhea does not resolve, I have  encouraged her to contact you.  Thanks for allowing me to share the  care.    Sincerely,      Loretha Brasil.  Lia Foyer, MD, [facility]  Electronically Signed    TDS/MedQ  DD: 03/04/2008  DT: 03/05/2008  Job #: [id number]

## 2010-10-20 NOTE — H&P (Signed)
Paula Walls, Paula Walls                ACCOUNT NO.:  [id number]   MEDICAL RECORD NO.:  [id number]          PATIENT TYPE:  INP   LOCATION:  2002                         FACILITY:  [facility]   PHYSICIAN:  Deboraha Sprang, MD, FACCDATE OF BIRTH:  Feb 13, 1932   DATE OF ADMISSION:  04/12/2007  DATE OF DISCHARGE:                              HISTORY & PHYSICAL   PRIMARY CARE PHYSICIAN:  Dr. Edrick Oh.   PRIMARY CARDIOLOGIST:  Dr. Caryl Comes.   CHIEF COMPLAINT:  Hypotension, possible sepsis.   HISTORY OF PRESENT ILLNESS:  Paula Walls is a 77 year old female with a  history of chronotropic incompetence in a St. Jude pacemaker.  She had a  carbuncle on her left upper thigh, and when it did not spontaneously  resolve, and the infection seemed to be spreading, she saw her primary  care physician, who lanced it and gave her antibiotics.  After that, she  continued to complain of fatigue and general malaise.  She had some  chills but does not think that she had any fevers.  She saw Dr. Caryl Comes,  who was concerned for the possibility of a systemic infection in her  pacemaker leads.  She is therefore admitted for further evaluation.  Of  note, she has not had chest pain and is not short of breath but does  complain of dysuria and cloudy urine.   PAST MEDICAL HISTORY:  1. Status post cardiac catheterization August 2005, with an EF okay,      left main okay, LAD 30% and 70 to 80% at the bifurcation with a      first diagonal, diagonal 80%, circumflex 20%, RCA okay, films      reviewed and medical therapy recommended.  2. History of sinus bradycardia with chronotropic incompetence by      stress echo, status post St. Jude DDR permanent pacemaker.  3. Dyslipidemia.  4. Hypertension.  5. Family history of coronary artery disease.  6. Hypothyroidism.   SURGICAL HISTORY:  She is status post cardiac catheterization, as well  as permanent pacemaker and partial thyroidectomy.   ALLERGIES:  NO KNOWN DRUG ALLERGIES.   CURRENT MEDICATIONS:  1. Levoxyl 100 mcg daily.  2. Diovan HCT 165/12.5 daily.  3. Aspirin 81 mg daily.  4. Lipitor 20 mg daily.  5. Metoprolol 50 mg 1/2 tablet b.i.d.  6. Sulfamethoxazole b.i.d.   SOCIAL HISTORY:  She lives in [place] alone and is retired from [employer] but working part-time at [employer].  She is a widow.  She has no  history of alcohol, tobacco or drug abuse.  She tries to stay fairly  active.   FAMILY HISTORY:  Her mother died at age 91, and her father died at age  41, both of them possibly having coronary artery disease, but they were  not hospitalized, and no autopsy was done.  She has no siblings with  coronary artery disease.   REVIEW OF SYSTEMS:  Significant for chills recently.  She has not taken  her temperature, but does not take she has had any fevers.  She has had  dysuria and urinary  frequency, but not hematuria.  She has occasional  arthralgias.  Since being on the antibiotics, she has had some diarrhea.  She has not had chest pain.  She has some dyspnea on exertion that is  chronic and unchanged.  She has not had any pre-syncope or syncope.  She  is coughing, but it is nonproductive and she is not wheezing.  A full 14-  point review of systems is otherwise negative.   PHYSICAL EXAM:  GENERAL:  She is a well developed, elderly white female  in no acute distress.  HEENT:  Normal.  NECK:  There is no lymphadenopathy, thyromegaly, bruit or JVD noted.  CVA:  Heart is regular in rate and rhythm with an S1-S2, and no  significant murmur, rub or gallop is noted.  Distal pulses are intact in  all four extremities, and no femoral bruits are appreciated.  LUNGS:  Clear to auscultation bilaterally.  SKIN:  She has an area of erythema with a central lesion that appears to  be healing, on the inner aspect of her left upper thigh.  The area of  erythema and is approximately 10 cm in diameter.  ABDOMEN:  Soft and nontender with active bowel sounds.   EXTREMITIES:  There is no cyanosis, clubbing or edema noted.  MUSCULOSKELETAL:  There is no joint deformity or effusions and no spine  or CVA tenderness.  NEURO:  She is alert and oriented.  Cranial nerves II-XII grossly  intact.   Chest x-ray, laboratory values and EKG are all pending at the time of  dictation.   IMPRESSION:  Possible sepsis:  Ms. Kitagawa's blood pressure is running  much lower than it normally does.  She has general malaise.  We do not  have a temperature yet, but we will obtain blood cultures x2 and then  start her on vancomycin.  CBC will be followed closely.  A 2-D echo will  be performed, and a TEE may be needed.  Internal medicine consult will  be obtained in the morning.  We will also check a urinalysis with  culture and start her on Flagyl for possible C. diff and obtained a  stool sample for testing as soon as possible.  She will be continued on  her home medications, and we will check a TSH as well.      Paula Ferries, PA-C      Deboraha Sprang, MD, [facility]  Electronically Signed    Paula Walls  D:  04/12/2007  T:  04/13/2007  Job:  [id number]   cc:   Paula Walls, M.D.

## 2010-10-20 NOTE — H&P (Signed)
NAMEJAVON, Paula Walls                ACCOUNT NO.:  [id number]   MEDICAL RECORD NO.:  [id number]          PATIENT TYPE:  OIB   LOCATION:  NA                           FACILITY:  [facility]   PHYSICIAN:  Loretha Brasil. Lia Foyer, MD, FACCDATE OF BIRTH:  [birth year]   DATE OF ADMISSION:  DATE OF DISCHARGE:                              HISTORY & PHYSICAL   CHIEF COMPLAINT:  Chest tightness.   HISTORY OF PRESENT ILLNESS:  The patient is a very pleasant 77 year old  white female well-known to [place].  She had a recent history of increased  exertional intolerance.  This has been associated with some chest  discomfort at high levels of exercise.  The patient previously underwent  catheterization in 2005.  At that time, she had fairly high-grade  bifurcational disease in the mid-left anterior descending artery.  No  RCA disease and a 20-30% mid circumflex stenosis.  As a result, she has  had increasing symptoms over several months.  She has seen Dr. Edrick Oh  and then seen in followup and subsequently referred for repeat  evaluation here.  Since I last saw her, she was admitted for a  methicillin-resistant staph boil on her leg. TEE was negative.  She  received antibiotics and subsequently recovered.  She was also found to  have iron deficiency, and subsequently received IV iron.  I am not sure  of any source was ever really found for this, but she has been followed  closely by Dr. Edrick Oh since that time.   PAST MEDICAL HISTORY:  1. Remarkable for coronary artery disease by cath in 2005.  2. Chronotropic incompetence, status post pacer implantation with      atrial pacing.  3. Methicillin-resistant staph boil in 2008 in the left thigh.  4. Hyperlipidemia.  5. Hypertension.  6. Hypothyroidism.  7. Iron deficiency anemia.   ALLERGIES:  FLAGYL.   MEDICATIONS:  1. Diovan HCT 160/12.5 mg.  2. Levoxyl 100 mcg daily.  3. Metoprolol ER 50 mg p.o. b.i.d.  4. Lipitor 20 mg daily.   SOCIAL HISTORY:  The patient  is widowed.  Her husband died in last year.  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  She has two children.  She  is a nonsmoker.   FAMILY HISTORY:  Her father died at 56 of old age.  Mother died at 41 of  old age.  She has one brother who died at age 27.   REVIEW OF SYSTEMS:  Her vision has been good.  She has not had fever,  chills, diarrhea, or other major GI symptoms currently.   PHYSICAL EXAMINATION:  On examination, she is alert and oriented female  in no acute distress.  The neck is supple.  Carotid upstrokes are brisk.  No jugular venous distention.  The lung fields are clear to auscultation  and percussion.  The cardiac rhythm is regular.  The PMI is  nondisplaced.  There is a normal first and second heart sound without  obvious murmur, rub or gallop.  The pacer site looks intact.  Abdomen is  soft without obvious  hepatosplenomegaly.  Femoral pulses are intact.  Extremities without edema.  Neurologic exam is nonfocal.   EKG reveals atrial pacing otherwise okay.   IMPRESSION:  1. Coronary artery disease with increased symptomatology suggestive of      increase in exertional angina from class I to class II, III.  2. Hyperlipidemia.  3. Hypothyroidism.  4. Prior pacer implantation for chronotropic incompetence.  5. Prior treatment for methicillin-resistant Staphylococcus.  6. Iron deficiency status post intravenous iron replacement.   RECOMMENDATIONS:  The patient will be admitted for cardiac  catheterization.  Risks, benefits, and alternatives have been discussed  with the patient, and she has considered this and she agrees to proceed.      Loretha Brasil. Lia Foyer, MD, [facility]  Electronically Signed     TDS/MEDQ  D:  02/15/2008  T:  02/16/2008  Job:  [id number]

## 2010-10-20 NOTE — Assessment & Plan Note (Signed)
[place] OFFICE NOTE   Paula Walls, Paula Walls                       MRN:          [id number]  DATE:01/30/2008                            DOB:          07/21/1931    CHIEF COMPLAINT:  Chest tightness with exertion.   Paula Walls is a very delightful 77 year old woman with a history of  hypertension and hyperlipidemia, and also conduction system disease with  prior pacemaker who has been followed by Dr. Edrick Oh.  She has been  noticing some chest tightness with exertion.  She notices that her pulse  goes up when this occurs.  She has no obvious symptoms at rest.  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  She does get little bit of tightness in her legs with walking as well.  She has also noted some cramping in her hands.  This has been occurring  for the past couple of months.  She has previously undergone  catheterization procedure in 2003-08-16, and then at that time had 70-80%  focal stenosis of the LAD for which she has been treated medically.  Following this, she subsequently had a pacemaker placed and she has been  treated medically without significant recurrence of disease.  Importantly, she developed methicillin-resistant Staph in her leg and  this was associated with anemia.  She had to be treated aggressively.   She apparently has recovered from this.   PAST MEDICAL HISTORY:  Remarkable for defined coronary artery disease,  prior pacemaker insertion, MRSA as noted.  She has had hypothyroidism  since Aug 15, 1990.   CURRENT MEDICATIONS:  1. Levoxyl 100 mg daily.  2. Lipitor 20 mg daily.  3. Diovan 160/12.5 daily.  4. Metoprolol ER 50 mg p.o. b.i.d.   FAMILY HISTORY:  Remarkable for father who died at 16, mother at 63, and  a brother at 70.   SOCIAL HISTORY:  She is a nonsmoker.  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  Her husband died in Aug 15, 2002.   REVIEW OF SYSTEMS:  She has not had hematochezia.  She has not had  melena.  She has not had hematemesis or hemoptysis.  She has not had  significant cough or pulmonary congestion.  Her vision and hearing has  been good.  In reviewing her cardiac history, these symptoms only occur  at a fairly good distance.   PHYSICAL EXAMINATION:  GENERAL:  Today, she is alert and oriented, in no  acute distress.  VITAL SIGNS:  Blood pressure is 124/70, the pulse is 60.  LUNGS:  Lung fields actually are clear to auscultation and percussion.  NECK:  No carotid bruits noted.  There is no significant jugular venous  distention.  HEART:  PMI is nondisplaced.  There is a normal first and second heart  sound without murmur or rub.  A pacemaker is in place.  ABDOMEN:  Soft without hepatosplenomegaly.  EXTREMITIES:  Pulses are intact.   EKG today reveals normal sinus rhythm based on electronic atrial  pacemaker.  Her QRS is normal.  There are no acute changes.   IMPRESSION:  1. Exertional chest tightness compatible with angina.  2. Known coronary artery disease by previous catheterization, 2005.  3. Status post permanent pacer implantation.  4. Hypothyroidism, on thyroid replacement therapy.  5. Hypercholesterolemia, on lipid lowering therapy.  6. History of rotator cuff injury.  7. History of allergic contact dermatitis.  8. History of methicillin-resistant Staphylococcus aureus.   PLAN:  My recommendation would be that the patient consider cardiac  catheterization.  She wants to think about this, and will contact our  office for proceeding on with diagnostic catheterization.  She has been  through this before and risks, benefits have been discussed.  She will  make contact to [place] after she makes a decision.     Loretha Brasil. Lia Foyer, MD, [facility]  Electronically Signed    TDS/MedQ  DD: 02/10/2008  DT: 02/10/2008  Job #: [id number]   cc:   Margarita Rana, M.D.

## 2010-10-20 NOTE — Assessment & Plan Note (Signed)
[place]                            CARDIOLOGY OFFICE NOTE   ABBEYGAIL, Paula Walls                       MRN:          [id number]  DATE:05/22/2008                            DOB:          08-05-1931    Paula Walls is in for a followup visit.  In general, she is stable.  She  has been treated medically.  We had called, and she got in to see Dr.  Laural Golden.  She now has a diagnosis of ulcerative colitis.  The medicine  that was prescribed is about 900 dollars a month which she said she does  not think she can take.  She is going to call his office with the hope  of getting some alternative type of therapy.  She did have colonoscopy  done.   MEDICATIONS:  1. Diovan HCT 160/12.5 daily.  2. Levoxyl 100 mcg daily.  3. Metoprolol ER 50 mg b.i.d.  4. Lipitor 20 mg daily.   PHYSICAL EXAMINATION:  She is alert and oriented in no distress.  Blood  pressure 143/66, pulse 60 and regular.  Lung fields are clear.  The  cardiac rhythm is regular.   No EKG was done today.  She has had a recent exercise tolerance test.   IMPRESSION:  1. Coronary artery disease, predominance of the left anterior      descending artery treated medically.  2. Hypercholesterolemia on lipid lowering therapy.  3. Prior pacemaker placement.  4. Ulcerative colitis.  5. Hypothyroidism on thyroid replacement therapy.  6. Hypercholesterolemia.  7. History of rotator cuff injury.  8. History of methicillin-resistant Staphylococcus aureus.   RECOMMENDATIONS:  1. Continue current medical regimen.  2. Return to clinic in approximately 4 months.     Loretha Brasil. Lia Foyer, MD, [facility]  Electronically Signed    TDS/MedQ  DD: 05/22/2008  DT: 05/23/2008  Job #: [id number]   cc:   Margarita Rana, M.D.

## 2010-10-20 NOTE — Procedures (Signed)
[facility]                              EXERCISE TREADMILL   NAME:Paula Walls, Paula Walls                       MRN:          [id number]  DATE:04/11/2008                            DOB:          [birth year]    DURATION OF EXERCISE:  3 minutes 18 seconds.   MAXIMAL HEART RATE:  92.   PERCENT OF PREDICTED MAXIMAL HEART RATE:  63%.   COMMENTS:  The patient exercised today on the Bruce protocol.  She did  not experienced chest pain.  Quite frankly, she was unable physically to  continue exercise beyond 3 minutes.  However, it was not due to  shortness of breath, but seem to do as much to orthopedic issues.  There  was no evidence of obvious ischemia, but her exercise limit rate was  low.  The patient has underwent cardiac catheterization.  At the  bifurcation of the major diagonal branch, there was a focal stenosis  measuring about 75 to perhaps 80% luminal reduction.  The diagonal  itself has an 80% ostial stenosis and divides into 2 branches.  She has  class II symptoms, and my inclination was to treat her medically, based  on the complexity of the lesion and the fact that she is clinically  stable.  We felt that the continued medical therapy was warranted at  this time.  However, she could not go very far,   One additional problem is that the patient has been having lots and lots  of diarrhea.  She has been by Dr. Edrick Oh.  They have tried to get her an  appointment to see Dr. Laural Golden, but this has been difficult.  The  mechanism of this remains unclear.  She has had a TSH done fortunately  in the summertime, and it was 0.913, so it would be unlikely to be due  to hyperthyroidism.  We will try to give Dr. Olevia Perches office a call and  see if we can facilitate an earlier evaluation.  In the meantime, we  will continue to treat her medically.     Loretha Brasil. Lia Foyer, MD, [facility]  Electronically Signed    TDS/MedQ  DD: 04/12/2008  DT: 04/12/2008  Job #:  [id number]   cc:   Margarita Rana, M.D.

## 2010-10-20 NOTE — Letter (Signed)
May 18, 2007    Margarita Rana, M.D.  [address]  [place], [place] [address]   RE:  SHAYLYNNE, LUNT  MRN:  [id number]  /  DOB:  May 17, 1932   Dear [name] Cancel:   I hope this letter finds you well.  Ms. Prisco comes in today with her  daughter feeling much better since we put her in the hospital and  treated her for presumed infection from the MRSA boil.  She is back to  her normal self.  Her daughter notices 2 issues, 1 is her blood  pressure, and the 2nd is continued exercise intolerance.  In reviewing  the chart, she had this high-grade LAD lesion.  She underwent Myoview  scanning.  There was anterior ischemia.  It was felt, however, by Addison Lank, that this was a high-risk lesion upon which to intervene.  It  is fairly clear to both the patient and her daughter that her symptoms  have not worsened significantly over the last couple of years.   Given that, I think the thing to do is to work on her medical management  and her blood pressure.  To that end, I have asked her to increase her  Diovan from 80 to 160/25, and her metoprolol from 25 b.i.d. to 50 b.i.d.   We plan to see her again in 9 months' time.   I have asked her to follow up with you in a couple of weeks to check her  BNP and her blood pressure.   I hope you have a Merry Christmas.    Sincerely,      Deboraha Sprang, MD, [facility]  Electronically Signed    SCK/MedQ  DD: 05/18/2007  DT: 05/18/2007  Job #: [id number]

## 2010-10-20 NOTE — Letter (Signed)
February 13, 2008    Paula Walls, M.D.  [address].  [place], [place] [address]   RE:  Paula Walls  MRN:  [id number]  /  DOB:  [birth year]   Dear Paula Walls:   Ms. Backhaus come in today for followup of her pacemaker which is  functioning well.  She has sinus node dysfunction and actually sinus  node arrest.  Her major complaint is some fatigue and exercise  intolerance.  As you know, she is scheduled to undergo catheterization  later this week with Dr. Lia Foyer.  She also describes daytime somnolence  as well as waking herself up at night snoring.   CURRENT MEDICATIONS:  1. Diovan.  2. Levoxyl.  3. Metoprolol 50 b.i.d.  4. Lipitor 20.   PHYSICAL EXAMINATION:  VITAL SIGNS:  Blood pressure 126/72, pulse was  68.  LUNGS:  Clear.  HEART:  Sounds were regular.  EXTREMITIES:  Without edema.  SKIN:  Warm and dry.   Interrogation of her St. Jude Identity pulse generator demonstrates no  intrinsic atrial rhythm with impedance of 47, threshold point of 0.5 to  0.4, R-wave was 14.1, impedance 419, a threshold 0.75 to 0.4.  Battery  voltage 2.76.   IMPRESSION:  1. Sinus node dysfunction.  2. Status post pacer for the above.  3. Exertional chest pain with anticipated catheterization.  4. Nocturnal snoring, daytime somnolence, question of obstructive      sleep apnea.   Len, I hope you this letter finds you well.  Paula Walls's other issue  today was fatigue and snoring.  I wonder whether she might not benefit  from a sleep study.   I suggested that she follow up with you about this.  We will see her  again in 6 months' time in the device clinic.    Sincerely,      Deboraha Sprang, MD, [facility]  Electronically Signed    SCK/MedQ  DD: 02/13/2008  DT: 02/13/2008  Job #: [id number]

## 2010-10-20 NOTE — Assessment & Plan Note (Signed)
[facility]   NAME:  CARLISIA, GENO                ACCOUNT NO.:  [id number]   MEDICAL RECORD NO.:  [id number]      DATE OF BIRTH:  [birth year]   PHYSICIAN:  Epifania Gore. Nils Pyle, M.D. VISIT DATE:  04/11/2007                                   OFFICE VISIT   CLINIC NOTE:   SUBJECTIVE:  Ms. Zellers is a 77 year old female referred by Dr. Edrick Oh  for evaluation of an abscess on the medial aspect of the left lower  extremity.   IMPRESSION:  Effectively incised and drained abscess left medial thigh.   RECOMMENDATIONS:  Continuation of the Septra DS one p.o. b.i.d. with the  addition of daily.  Sitz bath and warm soak.  Reevaluation of the  patient in 1 week, as she may require a second I&D for the presence of  loculations.   HISTORY OF PRESENT ILLNESS:  Ms. Jon is a 77 year old lady who has  been under the care of Dr. Edrick Oh for several years.  He has treated her  for coronary artery disease, hyperlipidemia, hypothyroidism and  hypertension.   The present abscess was first noted approximately 2-1/2 to 3 weeks ago.  It was treated with incision and drainage and antibiotics.  The patient  never had fever spikes or chills.  She denied any associated trauma.  Following the incision and drainage, the wound has become less tender  but is still present and is moderately discomforting.   PAST MEDICAL HISTORY:  Remarkable as per above.   ALLERGIES:  PENICILLIN.   CURRENT MEDICATIONS:  1. Diovan hematocrit 160-12.5 one p.o. daily.  2. Metoprolol Tartrate 50 mg tablet one b.i.d.  3. Lipitor 20 mg daily.  4. Septra double-strength one p.o. b.i.d.   PAST SURGICAL HISTORY:  1. Subtotal thyroidectomy.  2. Permanent pacemaker.   FAMILY HISTORY:  Positive for coronary disease, strokes and cancer.  She  is a widow.  She has two adult daughters who live in the local area.  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   REVIEW OF SYSTEMS:   She denies transient visual changes, paralysis or  any stigmata of TIA.  She has never smoked.  She denies a chronic cough.  Her weight has been stable.  She specifically denies chest pain.  She  does have some moderate exercise intolerance to walking uphill.  She  does not take stairs.  There is specifically no history of angina.  She  has no food allergies.  There are no bowel or bladder complaints.  The  remainder of the review of systems is negative.   PHYSICAL EXAMINATION:  GENERAL:  She is an alert, oriented female in no  acute distress.  She is accompanied by her daughter and appears in good  contact with reality.  HEENT:  Clear.  NECK:  Supple.  VITAL SIGNS:  Blood pressure is 109/66, respirations 16, pulse rate is  58, temperature is 97.7.  LUNGS:  Clear.  HEART:  The heart sounds are normal.  ABDOMEN:  Soft.  EXTREMITIES:  Remarkable for +3 bilateral dorsalis pedis pulsations and  no edema.  On the medial aspect of the left lower extremity, there is a  firm, well-adherent eschar measuring approximately  1 cm x 1.5 cm.  There  is no drainage.  There is some induration that is nontender.  There is  no local warmth.  There is no malodor.  NEUROLOGICALLY:  The patient is grossly intact.   DISCUSSION:  Ms. Ahlgrim has apparently had an incision and drainage of a  medial thigh abscess and has started on Septra.  The appearance of the  wound today shows what appears to be a receding inflammatory phlegmon.  It is likely that the abscess is undergoing a normal involution  following effective and adequate incision and drainage.  Nevertheless,  we will add a sitz bath to her regimen, we will continue her Septra and  reevaluate her in 1 week.   We have counseled the patient with regard to the possibility of  recurrent fever, fluctuance or pain.  If any of this is to occur, she is  to call the wound center and we will see her on an emergent basis.  We  have given the patient and her  daughter an opportunity to ask questions.  They seem to understand.  They express gratitude for having been seen in  the clinic and indicate that they will be compliant.      Harold A. Nils Pyle, M.D.  Electronically Signed     HAN/MEDQ  D:  04/11/2007  T:  04/12/2007  Job:  [id number]   cc:   Margarita Rana, M.D.

## 2010-10-20 NOTE — Assessment & Plan Note (Signed)
[place]                         ELECTROPHYSIOLOGY OFFICE NOTE   SEANNE, CHIRICO                       MRN:          [id number]  DATE:02/16/2007                            DOB:          [birth year]    Ms. Alejo was seen today in the [facility] for followup of her [facility].  Chief Executive Officer [phone].  Her device was implanted on  February 06, 2004 for sinus node dysfunction.   INTERROGATION OF DEVICE:  Demonstrates a silent atrium, P waves were  unable to be determined as she was paced at 30 beats per minute, her  atrial impedance was 480 ohms with a threshold of 0.75 volts at 0.5  milliseconds, R waves measured 13.8 to 16.6 millivolts with an RV  impedance of 388 ohms with a threshold of 0.875 volts at 0.5  milliseconds, battery voltage was 2.78 volts.  She is pacemaker  dependent.  She did have 4 ventricular high rate episodes over the  summer, 2 of which appear to be SVT.  She correlated these with a  reaction to some roundup and has not had any symptoms since then.  She  is programmed ADDR with a low rate of 60, up rate of 110 with a paced AV  of 250 milliseconds and  a sensed AV of 225 milliseconds.  She is A  paced greater than 99% of the time, V paced less than 1% of the time.  She does TTM's and will return to clinic in December to review these  episodes with Dr. Caryl Comes.  If she has more symptoms between now and then,  she will call for an earlier appointment.      Chanetta Marshall, RN,BSN  Electronically Signed      Deboraha Sprang, MD, [facility]  Electronically Signed   AS/MedQ  DD: 02/16/2007  DT: 02/16/2007  Job #: [id number]

## 2010-10-22 ENCOUNTER — Encounter: Payer: Self-pay | Admitting: Internal Medicine

## 2010-10-22 DIAGNOSIS — I495 Sick sinus syndrome: Secondary | ICD-10-CM

## 2010-10-23 NOTE — Op Note (Signed)
NAME:  Paula Walls, Paula Walls                          ACCOUNT NO.:  [id number]   MEDICAL RECORD NO.:  [id number]                   PATIENT TYPE:  OIB   LOCATION:  5053                                 FACILITY:  [facility]   PHYSICIAN:  Deboraha Sprang, M.D.               DATE OF BIRTH:  [birth year]   DATE OF PROCEDURE:  02/06/2004  DATE OF DISCHARGE:                                 OPERATIVE REPORT   PREOPERATIVE DIAGNOSIS:  Symptomatic sinus node dysfunction.   POSTOPERATIVE DIAGNOSIS:  Symptomatic sinus node dysfunction.   OPERATION PERFORMED:  Dual chamber pacemaker implantation.   SURGEON:  Deboraha Sprang, M.D.   DESCRIPTION OF PROCEDURE:  Following the obtaining of informed consent, the  patient was brought to the electrophysiology laboratory and placed on the  fluoroscopic table in supine position.  After routine prep and drape of the  left upper chest, lidocaine was infiltrated along the prepectoral  subclavicular region.  Incision was made and carried down to the layer of  the prepectoral fascia using the electrocautery and sharp dissection.  A  pocket was formed similarly.   Hemostasis was obtained.  Thereafter attention was turned to gaining access  to the extrathoracic left subclavian vein which turned out to be fairly  difficult.  Ultimately, I was able to get a Wholey wire to pass into the  innominate and into the SVC and I then used a double wire technique. There  was neither aspiration of air nor puncture of the artery.  Sequentially, 7  French tear away introducer sheaths were placed through which was then  passed a St. Jude 1646 52 cm passive fixation ventricular lead serial  [id number].  Under fluoroscopic guidance it was manipulated to the right  ventricular apex where the bipolar R-wave was 23.1 mV with pacing impedance  of 889 ohms, a pacing threshold of 0.5 V at 0.5 msec, current at threshold  of 0.5 mA and there was no diaphragmatic pacing at 10V.   This lead was  secured to the prepectoral fascia and over the other guidewire  was placed a sheath and through this was passed a St. Jude 1642-T 46 cm  passive fixation atrial lead, serial number [id number].  Under fluoroscopic  guidance it was manipulated to the right atrial appendage where the bipolar  P-wave was 3.9 mV with a pacing impedance of 602 ohms and threshold 0.7 V at  0.5 msec.  Current at threshold was 1.1 mA.  This lead was secured to the  prepectoral fascia.  The leads were then attached to the [place] Identity  80XL pulse generator, serial [id number], model 5386.  Ventricular pacing and  atrial pacing were identified.  The pocket was copiously irrigated with  antibiotic containing saline solution.  Hemostasis was assured and the leads  and pulse generator were placed in the pocket and secured to the prepectoral  fascia.  The wound was then  closed in three layers in normal fashion.  The  wound was washed, dried and a benzoin Steri-Strip dressing was applied.  Sponge count, needle count and instrument count were correct at the end of  the procedure according to the staff.   The patient tolerated the procedure without apparent complication.                                               Deboraha Sprang, M.D.    SCK/MEDQ  D:  02/06/2004  T:  02/06/2004  Job:  [id number]   cc:   Pat Patrick. Rayford Halsted, M.D.  [address] [place]  [place]  [place] [address]  Fax: [phone]

## 2010-10-23 NOTE — Discharge Summary (Signed)
NAMESALINA, Paula Walls                ACCOUNT NO.:  [id number]   MEDICAL RECORD NO.:  [id number]          PATIENT TYPE:  INP   LOCATION:  2002                         FACILITY:  [facility]   PHYSICIAN:  Deboraha Sprang, MD, FACCDATE OF BIRTH:  04/20/1932   DATE OF ADMISSION:  04/12/2007  DATE OF DISCHARGE:  04/17/2007                               DISCHARGE SUMMARY   ALLERGIES:  NO KNOWN DRUG ALLERGIES.  Now she has a headache with  BACTRIM and itching with FLAGYL, these are 2 intolerances to note.   FINAL DIAGNOSES:  1. Admitted with malaise, weakness, chills, fever, hypotension.  2. Recent methicillin-resistant Staphylococcus aureus right thigh      infection and given Bactrim as an outpatient.  3. Intravenous vancomycin this admission, home on doxycycline  4. 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      a.     Question of pacemaker endocarditis.      b.     Transesophageal echocardiogram April 13, 2007, negative       for vegetations, normal left ventricular function.      c.     Blood cultures negative  5. Iron deficiency anemia this admission.  6. Dysphasia, follow up with Dr. Laural Golden.   SECONDARY DIAGNOSES:  1. Status post left heart catheterization August 2005, ejection      fraction normal, left main is okay.  Left anterior descending had a      30% and then 70-80% at the bifurcation with the first diagonal.      Diagonal itself had 80%, left circumflex 20%, right coronary artery      angiographically normal  2. Dyslipidemia.  3. Hypertension.  4. Family history of coronary artery disease.  5. Treated hypothyroidism.  6. Renal insufficiency.  7. The patient has had partial thyroidectomy.   PROCEDURE:  Transesophageal echocardiogram April 13, 2007.  Negative  for vegetations, normal left ventricular function.   CONSULTATIONS:  1. Internal medicine to adjust medications.  2. Infectious disease for antibiotic control.  Original plan  with      infectious disease was to continue IV vancomycin this      hospitalization and then discharge on linezolid.  However, this      drug was prohibitively expensive.  The patient was discharged on      doxycycline.   BRIEF HISTORY:  Paula Walls is a 77 year old female.  She has  chronotropic incompetence and she is status post permanent pacemaker  implantation for this.   She had a carbuncle in her left upper thigh.  It did not spontaneously  resolve and the infection seemed to be spreading.  Her primary caregiver  lanced it, cultured it and gave  her antibiotics.  The culture grew out  MRSA.   She has seen Dr. Caryl Comes in the office.  He is concerned about the  possibility of a systemic infection with locus at the pacemaker leads.  She is admitted for further evaluation.  She is not having chest pain.  She is not short of breath.  She does  complain of dysuria and cloudy  urine.   IMPRESSION:  Possible sepsis.  The patient's blood pressure runs lower  than it normally does.  She has general malaise. Blood cultures will be  obtained x2.  Her CBC will be followed closely. Transesophageal  echocardiogram will be needed.  Internal medicine consult in the  morning.  She was started on Flagyl for possible C. diff after a stool  sample was obtained.  She will have her TSH checked as well.   HOSPITAL COURSE:  Paula Walls was admitted from the office of [place] on April 12, 2007  complaining of fevers, chills, malaise,  weakness.  She had recent MRSA infection and had been given treatment  with Bactrim.  Bactrim caused headaches.  She was admitted here at [facility] and started on Zocor and Flagyl.  These were discontinued  at the very outset primarily because the patient reported itching after  these medications were started.  She was then started and maintained  throughout this hospitalization on IV vancomycin.  Blood cultures  returned negative.  Transesophageal  echocardiogram also showed no  evidence of vegetations and normal left ventricular function.  Infectious disease felt that the patient may have had a transient  bacteremia, but blood cultures were negative because of the Bactrim that  she had been taking.  There was no evidence of endocarditis and the  patient actually has clinically improved during this hospitalization.  Their recommendation going forward is a 2 week course of antibiotics as  outpatient.  They have recommended linezolid for 11 more days, but  because of the incredibly prohibitive expense of this antibiotic, the  patient was changed to doxycycline instead with close follow up with  blood cultures and blood count.   DISCHARGE MEDICATIONS:  1. Doxycycline 100 mg twice daily to take from April 18, 2007 to      April 27, 2007.  This is a new medication.  2. Enteric-coated aspirin 81 mg daily.  3. Levothyroxine 100 mcg daily.  4. Diovan 80 mg daily.  She is to stop taking      Diovan/hydrochlorothiazide.  5. Metoprolol 50 mg one-half tablet in the morning and one-half tablet      in the evening.  6. Ferrous sulfate 325 mg twice daily.  7. Colace 100 mg twice daily.  8. Lipitor 20 mg daily at bedtime.   FOLLOW UP:  1. The patient will see Dr. Edrick Oh after blood work at his lab Monday,      April 24, 2007 at 9 a.m., also blood work will be taken May 04, 2007 at 9 a.m. and  May 11, 2007 at 9 a.m.  2. She has an office visit with Dr. Caryl Comes Thursday, May 18, 2007      at 8:45.  3. She will see Dr. Laural Golden, gastroenterologist in [place] on Wednesday,      June 14, 2007.  She is to take all her medications with her to      that office visit.   LABORATORY DATA:  Complete blood count on April 13, 2007:  White cells  5.6, hemoglobin 9.6, hematocrit 28.3, platelets of 173.  ESR was  elevated at 55.  Serum electrolytes on  April 16, 2007:  Sodium 139,  potassium 5, chloride 108, carbonate 27,  glucose 98, BUN 22, creatinine  0.91.  Urinalysis is negative.  TSH 0.500, C. diff  assay was negative.  Urine culture  negative.  Blood culture negative x2 for 5 days.      Sueanne Margarita, [place]      Deboraha Sprang, MD, [facility]  Electronically Signed    GM/MEDQ  D:  07/07/2007  T:  07/08/2007  Job:  [id number]   cc:   Margarita Rana, M.D.  Deboraha Sprang, MD, [facility]  Hildred Laser, M.D.

## 2010-10-23 NOTE — Cardiovascular Report (Signed)
NAME:  Paula Walls, Paula Walls                          ACCOUNT NO.:  [id number]   MEDICAL RECORD NO.:  [id number]                   PATIENT TYPE:  OIB   LOCATION:  2899                                 FACILITY:  [facility]   PHYSICIAN:  Loretha Brasil. Lia Foyer, M.D. [facility]         DATE OF BIRTH:  [birth year]   DATE OF PROCEDURE:  02/04/2004  DATE OF DISCHARGE:                              CARDIAC CATHETERIZATION   INDICATIONS:  Paula Walls is a 77 year old who has what is thought to be  chronotropic incompetence.  Unfortunately, she has also had some discomfort  in the chest with exertion.  The current study was done prior to planned  pacemaker implantation to re-evaluate her situation.  Risks, benefits, and  alternatives were discussed with the patient and her daughter in detail.  They consented to proceed.   PROCEDURES:  1.  Left heart catheterization.  2.  Selective coronary arteriography.  3.  Selective left ventriculography.   DESCRIPTION OF PROCEDURE:  The patient was brought to the catheterization  lab and prepped and draped in the usual fashion.  Through an anterior  puncture, the right femoral artery was easily entered.  Central aortic and  left ventricular pressures were measured with a pigtail.  The patient was  noted to be markedly hypertensive.  Intravenous labetalol 20 mg was then  administered.  Following this, ventriculography was performed in the RAO  projection.  This was followed by coronary arteriography of the right  coronary artery, then the left coronary artery.  An additional 10 mg of  labetalol was administered.  Prior to the final views of the left coronary  artery, intracoronary nitroglycerin was administered to better evaluate the  LAD.  Overall she tolerated the procedure well.  Blood pressure eventually  came down, and she was taken to the holding area in satisfactory clinical  condition.   HEMODYNAMIC DATA:  1.  Central aorta 232/97, mean 149.  2.  Left ventricle  230/12.  3.  No gradient on pullback across the aortic valve.   ANGIOGRAPHIC DATA:  1.  Ventriculography was performed in the RAO projection.  Overall systolic      function was well-preserved.  No segmental abnormalities of contraction      were identified.  2.  There was moderate calcification of the mid-left anterior descending      artery.  3.  The left main coronary artery was free of critical disease.  4.  The LAD has multiple areas of luminal irregularity of about 30% in the      proximal and proximal midvessel.  At the bifurcation of a diagonal      branch in the LAD, there is about a 70-80% area of focal stenosis.  The      severity of the stenosis looks worse in the RAO views.  This also      involves the diagonal branch, which itself has about 80% narrowing.  This diagonal branch is only about 2 mm in size and bifurcates just      distal to the lesion itself.  The distal LAD is without critical      narrowing.  5.  The circumflex provides two marginal branches.  The circumflex itself is      without critical narrowing.  There is perhaps 20-30% narrowing in the      proximal vessel after the tiny first marginal branch.  Critical focal      narrowing is not noted.  6.  The right coronary artery is a large-caliber vessel that is free of      critical disease.   CONCLUSION:  1.  Well-preserved left ventricular function.  2.  No critical stenosis of the circumflex or right coronary artery.  3.  High-grade bifurcational stenosis of the left anterior descending artery      diagonal.   DISPOSITION:  The LAD diagonal lesions itself is calcified.  There probably  is circumferential calcium, but it is difficult to tell without  intravascular ultrasound.  This may need to be done.  The lesion also  involves the bifurcation of what constitutes a small but significant  diagonal branch.  It is unlikely that the diagonal could be stented;  however, kissing balloon angioplasty could  be performed.  I will review the  films with my colleagues before making a final decision.  This will likely  affect her pacemaker implantation recommendation.                                               Loretha Brasil. Lia Foyer, M.D. [facility]    TDS/MEDQ  D:  02/04/2004  T:  02/05/2004  Job:  [id number]   cc:   Pat Patrick. Rayford Halsted, M.D.  [address] [place]  [address]  [place]  [place] [address]  Fax: [phone]   Deboraha Sprang, M.D.   [facility]   Trellis Moment, D.O.  [place]  [place] [address]  Fax: [phone]

## 2010-10-23 NOTE — Assessment & Plan Note (Signed)
[facility]                         ELECTROPHYSIOLOGY OFFICE NOTE   SALOMA, CADENA                       MRN:          [id number]  DATE:05/26/2006                            DOB:          1931-12-26    Ms. Paula Walls was seen today in the clinic on the 20th of December, 2007,  for followup of her [place] Identity.  Date of implant was  February 06, 2004, for sinus node dysfunction.  On interrogation of her  device today her battery voltage is 2.76, P waves were not measured.  She is dependent to a rate of 30 atrium with atrial capture threshold of  0.75 volts at 0.5 milliseconds and an atrial lead impedance of 481.  R  waves were 12.2 to 16.6 millivolts with a ventricular capture threshold  of 0.625 volts at 0.4 milliseconds and a ventricular lead impedance of  365.  Auto capture is on; however, no change is made in her parameters.  She will continue with her monthly telephone checks through [employer] and  return office visit in 1 year's time.      Alma Friendly, LPN  Electronically Signed      Deboraha Sprang, MD, [facility]  Electronically Signed   PO/MedQ  DD: 05/26/2006  DT: 05/26/2006  Job #: [id number]

## 2010-10-23 NOTE — Discharge Summary (Signed)
NAME:  Paula Walls, HOFFERBER                          ACCOUNT NO.:  [id number]   MEDICAL RECORD NO.:  [id number]                   PATIENT TYPE:  INP   LOCATION:  2094                                 FACILITY:  [facility]   PHYSICIAN:  Deboraha Sprang, M.D.               DATE OF BIRTH:  09-26-1931   DATE OF ADMISSION:  02/04/2004  DATE OF DISCHARGE:  02/07/2004                                 DISCHARGE SUMMARY   DISCHARGE DIAGNOSES:  1.  Admitted for sinus bradycardia.  Symptoms are dyspnea and decreased      exercise tolerance.  2.  Chronotropic incompetence demonstrated on stress echocardiogram.  3.  Chest pain at stress echocardiogram.  4.  High-grade lesion at the bil furcation of the left anterior descending      first diagonal on left heart catheterization August 30.  5.  Status post St. Jude DDDR Identify ADx XL DR Model 5386 implanted      February 06, 2004.   SECONDARY DIAGNOSES:  1.  Hypothyroidism.  2.  Dyslipidemia.  3.  Strong family history of coronary artery disease.  4.  Hypertension.   PROCEDURES:  1.  February 04, 2004, left heart catheterization.  This study showed that the      left ventricular ejection fraction and wall motions of the heart are all      normal.  The right coronary artery is free of significant disease.  The      left circumflex had a 20 to 30% proximal stenosis.  The LAD had tandem      30% stenoses proximally, but at the bifurcation of the first diagonal      had a 70 to 80% stenosis which included the ostium at 80% of the first      diagonal.  Once again, ejection fraction within normal limits.  2.  Implantation of Identify ADx XL DR Model 5386 St. Jude DDDR pacemaker by      Deboraha Sprang, M.D.  The pacer was interrogated after the procedure      September 1 showing no diaphragmatic stimulation at 10 volt pacing.  No      changes made.   The patient was begun on beta blocker for finding of high-grade lesion in  the coronary arteries in the setting  of persistent hypertension.  The  patient was discharged December 2.  She has had no complications after  either left heart catheterization or after implantation of permanent  pacemaker.  The incision site looks good.  There is no swelling or  ecchymoses.  Site is nontender.  She goes home on the following medications.   DISCHARGE MEDICATIONS:  1.  Metoprolol 50 mg 1 tablet in the morning, 1 tablet in the evening.  2.  Hydrochlorothiazide 12.5 mg daily.  3.  Levoxyl 100 mcg daily.  4.  Lipitor 10 mg daily at bedtime.  5.  Diovan 160  mg daily.   PAIN MANAGEMENT:  Pain management for the pacer site is Tylenol 325 mg 1 to  2 tablets every 4 to 6 hours as needed.   ACTIVITY:  Restrictions on mobility will be discussed with the patient. She  will be given a discharge sheet outlining progressive mobility over the next  week.   DIET:  Low-sodium, low-cholesterol diet.   WOUND CARE:  The patient is asked not to bathe for the next week, not to get  the incision wet where the pacemaker was implanted until Thursday, September  8.   FOLLOW UP:  1.  Dr. Lia Foyer at [facility] on Tuesday, February 11, 2004, at      11:45 in the morning.  2.  Pacer clinic Monday, March 02, 2004, at 9:45 in the morning.  3.  She will see Dr. Caryl Comes May 20, 2004, at 12:30 in the afternoon.   BRIEF HISTORY:  Mrs. Paula Walls is a 77 year old female.  She has a  history of longstanding bradycardia which has become symptomatic.  She gets  short of breath with exertion.  She has no chest discomfort, dizziness,  syncope, or palpitations.  She had a stress echocardiogram demonstrating  chronotropic incompetence.  She achieved only 2 METS with heart rate of 66.  The study was stopped secondary to dyspnea and fatigue.  One year ago, the  heart rate rose to 100 beats per minute with stress.  She was brought in for  catheterization August 30 and found to have a high-grade lesion at the LAD  bifurcation  with the first diagonal.  She is now awaiting percutaneous  coronary intervention, done in a staged procedure, and she will be coming in  electively as an outpatient.  In the meantime, she will be scheduled for  implantation of permanent pacemaker.   HOSPITAL COURSE:  The patient was brought in electively with the diagnosis  of symptomatic bradycardia but also chest pain during stress echocardiogram.  She underwent left heart catheterization which demonstrated high-grade  lesion as dictated above.  No intervention was scheduled at time of the  catheterization.  Instead, she underwent implantation of a permanent  pacemaker on September 1 by Dr. Virl Axe, tolerated the procedure well.  She will go home and then revisit Dr. Lia Foyer the week of September 5 to  discuss followup for percutaneous coronary intervention.      Sueanne Margarita, P.A.                    Deboraha Sprang, M.D.    GM/MEDQ  D:  02/06/2004  T:  02/08/2004  Job:  [id number]   cc:   Margarita Rana, M.D.  [place]  [place]  [place] [address]  Fax: [phone]   Deboraha Sprang, M.D.   Loretha Brasil. Lia Foyer, M.D. [place]

## 2011-01-21 ENCOUNTER — Encounter: Payer: Self-pay | Admitting: Internal Medicine

## 2011-01-21 DIAGNOSIS — I495 Sick sinus syndrome: Secondary | ICD-10-CM

## 2011-02-09 ENCOUNTER — Other Ambulatory Visit (INDEPENDENT_AMBULATORY_CARE_PROVIDER_SITE_OTHER): Payer: Self-pay | Admitting: Internal Medicine

## 2011-03-16 LAB — URINE CULTURE: Colony Count: 4000

## 2011-03-16 LAB — DIFFERENTIAL
Basophils Absolute: 0
Basophils Relative: 1
Eosinophils Absolute: 0.1
Eosinophils Relative: 2
Lymphocytes Relative: 15
Lymphs Abs: 0.8

## 2011-03-16 LAB — BASIC METABOLIC PANEL
BUN: 25 — ABNORMAL HIGH
BUN: 42 — ABNORMAL HIGH
CO2: 26
CO2: 27
Calcium: 8.7
Calcium: 8.7
Chloride: 101
Chloride: 105
Chloride: 108
Creatinine, Ser: 0.9
GFR calc Af Amer: 48 — ABNORMAL LOW
GFR calc non Af Amer: >60
GFR calc non Af Amer: >60
Glucose, Bld: 128 — ABNORMAL HIGH
Glucose, Bld: 98
Glucose, Bld: 99
Potassium: 5
Sodium: 132 — ABNORMAL LOW
Sodium: 135
Sodium: 139

## 2011-03-16 LAB — CBC
Hemoglobin: 9.6 — ABNORMAL LOW
MCHC: 34
MCV: 85.9
RDW: 13.6
RDW: 13.9

## 2011-03-16 LAB — OCCULT BLOOD X 1 CARD TO LAB, STOOL: Fecal Occult Bld: NEGATIVE

## 2011-03-16 LAB — URINALYSIS, ROUTINE W REFLEX MICROSCOPIC
Nitrite: NEGATIVE
Protein, ur: NEGATIVE
Urobilinogen, UA: 0.2

## 2011-03-16 LAB — TSH: TSH: 0.5

## 2011-03-16 LAB — CULTURE, BLOOD (ROUTINE X 2): Culture: NO GROWTH

## 2011-03-16 LAB — SEDIMENTATION RATE: Sed Rate: 55 — ABNORMAL HIGH

## 2011-03-16 LAB — CLOSTRIDIUM DIFFICILE EIA

## 2011-04-22 ENCOUNTER — Encounter: Payer: Self-pay | Admitting: Internal Medicine

## 2011-04-22 DIAGNOSIS — I495 Sick sinus syndrome: Secondary | ICD-10-CM

## 2011-07-22 ENCOUNTER — Encounter: Payer: Self-pay | Admitting: Internal Medicine

## 2011-07-22 DIAGNOSIS — I495 Sick sinus syndrome: Secondary | ICD-10-CM

## 2011-08-03 ENCOUNTER — Encounter: Payer: Self-pay | Admitting: Internal Medicine

## 2011-08-03 ENCOUNTER — Ambulatory Visit (INDEPENDENT_AMBULATORY_CARE_PROVIDER_SITE_OTHER): Payer: Medicare Other | Admitting: Internal Medicine

## 2011-08-03 DIAGNOSIS — I10 Essential (primary) hypertension: Secondary | ICD-10-CM

## 2011-08-03 DIAGNOSIS — Z950 Presence of cardiac pacemaker: Secondary | ICD-10-CM

## 2011-08-03 DIAGNOSIS — I2510 Atherosclerotic heart disease of native coronary artery without angina pectoris: Secondary | ICD-10-CM

## 2011-08-03 DIAGNOSIS — I495 Sick sinus syndrome: Secondary | ICD-10-CM

## 2011-08-03 LAB — PACEMAKER DEVICE OBSERVATION
AL THRESHOLD: 0.5 V
BAMS-0001: 170 {beats}/min
BAMS-0003: 60 {beats}/min
RV LEAD AMPLITUDE: 16.1 mv
RV LEAD THRESHOLD: 0.625 V

## 2011-08-03 NOTE — Assessment & Plan Note (Signed)
Her blood pressure is poorly controlled; she is taking 3 medications and remains with a systolic of 718-550. We discussed 2 strategies. The first was to increase her antihypertensive therapy, the other was to undertake a sleep study given her significant symptoms of daytime somnolence and suggestive obstructive nocturnal breathing. We'll pursue the latter and undertake a sleep study

## 2011-08-03 NOTE — Assessment & Plan Note (Signed)
Stable with 100% atrial pacing

## 2011-08-03 NOTE — Assessment & Plan Note (Signed)
The patient's device was interrogated.  The information was reviewed. No changes were made in the programming.    

## 2011-08-03 NOTE — Progress Notes (Signed)
  HPI  Paula Walls is a 78 y.o. female is seen following pacemaker implantation for sinus node dysfunction. She has no intrinsic atrial rhythm.  She has normal left ventricular function; by echo in 2008 and catheterization 2009 demonstrated moderate diffuse disease also with normal ejection fraction.  She recently had a problem with a sub-conjunctiva hemorrhage which was quite disconcerting  The patient denies SOB, chest pain, edema or palpitations  She does have significant daytime somnolence as well as awakens herself at night with snoring. Her blood pressures at home typically run in the 160-170 range. She is on 3 medications currently Past Medical History  Diagnosis Date  . Coronary artery disease   . Chronotropic incompetence   . Hyperlipidemia   . Hypertension   . Hypothyroidism   . Iron deficiency anemia   . Sinoatrial node dysfunction   . MRSA (methicillin resistant Staphylococcus aureus)     Past Surgical History  Procedure Date  . Cardiac catheterization 02/04/2004  . Pacemaker insertion 02/06/2004    St. Jude  . Thyroidectomy, partial     Current Outpatient Prescriptions  Medication Sig Dispense Refill  . folic acid (FOLVITE) 1 MG tablet TAKE ONE TABLET BY MOUTH EVERY DAY  90 tablet  4  . levothyroxine (SYNTHROID, LEVOTHROID) 100 MCG tablet Take 100 mcg by mouth daily.      . metoprolol succinate (TOPROL-XL) 50 MG 24 hr tablet Take 100 mg by mouth daily. Take with or immediately following a meal.      . Probiotic Product (ALIGN PO) Take by mouth as needed.      . Red Yeast Rice Extract (RED YEAST RICE PO) Take 600 mg by mouth daily.      . valsartan-hydrochlorothiazide (DIOVAN-HCT) 160-12.5 MG per tablet Take 1 tablet by mouth daily.        Allergies  Allergen Reactions  . Flagyl (Metronidazole Hcl)   . Metronidazole     Review of Systems negative except from HPI and PMH  Physical Exam BP 160/74  Pulse 70  Ht 5' (1.524 m)  Wt 132 lb 6.4 oz (60.056 kg)   BMI 25.86 kg/m2 Well developed and well nourished in no acute distress HENT normal E scleral and icterus clear Neck Supple JVP flat; carotids brisk and full Clear to ausculation Regular rate and rhythm, no murmurs gallops or rub Soft with active bowel sounds No clubbing cyanosis none Edema Alert and oriented, grossly normal motor and sensory function Skin Warm and Dry   Assessment and  Plan

## 2011-08-03 NOTE — Patient Instructions (Signed)
Your physician has recommended that you have a sleep study. This test records several body functions during sleep, including: brain activity, eye movement, oxygen and carbon dioxide blood levels, heart rate and rhythm, breathing rate and rhythm, the flow of air through your mouth and nose, snoring, body muscle movements, and chest and belly movement.  Your physician wants you to follow-up in: 1 year. You will receive a reminder letter in the mail two months in advance. If you don't receive a letter, please call our office to schedule the follow-up appointment.

## 2011-08-03 NOTE — Assessment & Plan Note (Signed)
Stable currently without symptoms

## 2011-08-18 ENCOUNTER — Encounter (HOSPITAL_BASED_OUTPATIENT_CLINIC_OR_DEPARTMENT_OTHER): Payer: Medicare Other

## 2011-08-29 ENCOUNTER — Other Ambulatory Visit: Payer: Self-pay | Admitting: Internal Medicine

## 2011-10-21 DIAGNOSIS — I495 Sick sinus syndrome: Secondary | ICD-10-CM

## 2012-01-21 DIAGNOSIS — I495 Sick sinus syndrome: Secondary | ICD-10-CM

## 2012-04-21 DIAGNOSIS — I495 Sick sinus syndrome: Secondary | ICD-10-CM

## 2012-05-18 ENCOUNTER — Other Ambulatory Visit: Payer: Self-pay | Admitting: Internal Medicine

## 2012-05-18 NOTE — Telephone Encounter (Signed)
New problem:   hctz 320 mg    [facility] in [place]  [phone].

## 2012-07-21 ENCOUNTER — Encounter: Payer: Self-pay | Admitting: Internal Medicine

## 2012-07-21 DIAGNOSIS — I495 Sick sinus syndrome: Secondary | ICD-10-CM

## 2012-08-04 ENCOUNTER — Ambulatory Visit (INDEPENDENT_AMBULATORY_CARE_PROVIDER_SITE_OTHER): Payer: Medicare Other | Admitting: Internal Medicine

## 2012-08-04 ENCOUNTER — Encounter: Payer: Self-pay | Admitting: Internal Medicine

## 2012-08-04 VITALS — BP 165/77 | HR 70 | Ht 60.0 in | Wt 134.8 lb

## 2012-08-04 DIAGNOSIS — R5383 Other fatigue: Secondary | ICD-10-CM

## 2012-08-04 DIAGNOSIS — I495 Sick sinus syndrome: Secondary | ICD-10-CM

## 2012-08-04 DIAGNOSIS — Z950 Presence of cardiac pacemaker: Secondary | ICD-10-CM

## 2012-08-04 DIAGNOSIS — I10 Essential (primary) hypertension: Secondary | ICD-10-CM

## 2012-08-04 LAB — PACEMAKER DEVICE OBSERVATION
AL IMPEDENCE PM: 498 Ohm
ATRIAL PACING PM: 100
BAMS-0001: 170 {beats}/min
BATTERY VOLTAGE: 2.75 V
RV LEAD AMPLITUDE: 13.8 mv

## 2012-08-04 MED ORDER — AMLODIPINE BESYLATE 2.5 MG PO TABS: 2.5000 mg | ORAL_TABLET | Freq: Every day | ORAL | Status: DC

## 2012-08-04 MED ORDER — METOPROLOL SUCCINATE ER 50 MG PO TB24: ORAL_TABLET | ORAL | Status: DC

## 2012-08-04 NOTE — Assessment & Plan Note (Signed)
Will try and reduce the betablocker to effect the fatigue and add amlodipine 2.5 to help with BP

## 2012-08-04 NOTE — Assessment & Plan Note (Signed)
The patient's device was interrogated.  The information was reviewed. No changes were made in the programming.    

## 2012-08-04 NOTE — Patient Instructions (Signed)
Your physician has recommended you make the following change in your medication:  1) Decrease metoprolol succinate (toprol) to 50 mg one tablet by mouth once daily. 2) Start amlodipine (norvasc) 2.5 mg one tablet by mouth once daily.  Your physician wants you to follow-up in: 1 year with Dr. Caryl Comes. You will receive a reminder letter in the mail two months in advance. If you don't receive a letter, please call our office to schedule the follow-up appointment.

## 2012-08-04 NOTE — Assessment & Plan Note (Signed)
May have sleep apnea  She will decide about whether she would like to pursue sleep study

## 2012-08-04 NOTE — Progress Notes (Signed)
Patient has no care team.   HPI  Paula Walls is a 79 y.o. female is seen following pacemaker implantation for sinus node dysfunction. She has no intrinsic atrial rhythm.   She has normal left ventricular function; by echo in 2008 and catheterization 2009 demonstrated moderate diffuse disease also with normal ejection fraction.   The patient denies SOB, chest pain, edema or palpitations  She does have significant daytime somnolence as well as awakens herself at night with snoring. Her blood pressures at home typically run in the 160-170 range. She is on 3 medications currently   Past Medical History  Diagnosis Date  . Coronary artery disease   . Chronotropic incompetence   . Hyperlipidemia   . Hypertension   . Hypothyroidism   . Iron deficiency anemia   . Sinoatrial node dysfunction   . MRSA (methicillin resistant Staphylococcus aureus)     Past Surgical History  Procedure Laterality Date  . Cardiac catheterization  02/04/2004  . Pacemaker insertion  02/06/2004    St. Jude  . Thyroidectomy, partial      Current Outpatient Prescriptions  Medication Sig Dispense Refill  . levothyroxine (SYNTHROID, LEVOTHROID) 100 MCG tablet Take 100 mcg by mouth daily.      . metoprolol succinate (TOPROL-XL) 50 MG 24 hr tablet Take 100 mg by mouth daily. Take with or immediately following a meal.      . Red Yeast Rice Extract (RED YEAST RICE PO) Take 600 mg by mouth daily.      . valsartan-hydrochlorothiazide (DIOVAN-HCT) 320-25 MG per tablet TAKE ONE TABLET BY MOUTH EVERY DAY  30 tablet  6   No current facility-administered medications for this visit.    Allergies  Allergen Reactions  . Flagyl (Metronidazole Hcl)   . Metronidazole     Review of Systems negative except from HPI and PMH  Physical Exam BP 165/77  Pulse 70  Ht 5' (1.524 m)  Wt 134 lb 12.8 oz (61.145 kg)  BMI 26.33 kg/m2 Well developed and well nourished in no acute distress HENT normal E scleral and icterus  clear Neck Supple JVP flat; carotids brisk and full Clear to ausculation regular rate and rhythm, no murmurs gallops or rub Soft with active bowel sounds No clubbing cyanosis none Edema Alert and oriented, grossly normal motor and sensory function Skin Warm and Dry  ecg  apcing 60 18/08/434    Assessment and  Plan

## 2012-08-04 NOTE — Assessment & Plan Note (Signed)
Stable post pacing

## 2012-10-20 DIAGNOSIS — I495 Sick sinus syndrome: Secondary | ICD-10-CM

## 2013-01-19 ENCOUNTER — Encounter: Payer: Self-pay | Admitting: Internal Medicine

## 2013-01-19 DIAGNOSIS — I495 Sick sinus syndrome: Secondary | ICD-10-CM

## 2013-01-23 ENCOUNTER — Other Ambulatory Visit: Payer: Self-pay | Admitting: Internal Medicine

## 2013-04-20 ENCOUNTER — Encounter: Payer: Self-pay | Admitting: Internal Medicine

## 2013-04-20 DIAGNOSIS — I495 Sick sinus syndrome: Secondary | ICD-10-CM

## 2013-06-11 ENCOUNTER — Encounter: Payer: Self-pay | Admitting: Gastroenterology

## 2013-07-04 ENCOUNTER — Ambulatory Visit (INDEPENDENT_AMBULATORY_CARE_PROVIDER_SITE_OTHER): Payer: Medicare Other | Admitting: Gastroenterology

## 2013-07-04 ENCOUNTER — Encounter: Payer: Self-pay | Admitting: Gastroenterology

## 2013-07-04 VITALS — BP 160/90 | HR 72 | Ht 59.5 in | Wt 134.4 lb

## 2013-07-04 DIAGNOSIS — K5190 Ulcerative colitis, unspecified, without complications: Secondary | ICD-10-CM

## 2013-07-04 DIAGNOSIS — R197 Diarrhea, unspecified: Secondary | ICD-10-CM

## 2013-07-04 DIAGNOSIS — R109 Unspecified abdominal pain: Secondary | ICD-10-CM

## 2013-07-04 MED ORDER — SULFASALAZINE 500 MG PO TABS: ORAL_TABLET | ORAL | Status: DC

## 2013-07-04 MED ORDER — LOPERAMIDE HCL 2 MG PO TABS: 2.0000 mg | ORAL_TABLET | Freq: Two times a day (BID) | ORAL | Status: DC

## 2013-07-04 NOTE — Progress Notes (Addendum)
    History of Present Illness: This is a 80 year old with a history of urgent pp diarrhea and lower abd crampy pain for 1 year. Her daughter is with her. She has the sensation of incomplete evacuation. She has had occasional episodes of incontinence. She was told of anemia by Dr. Murrell Redden office recently. She relates a history of UC diagnosed in 2009 by and followed by Dr. Laural Golden. Had colonoscopy in 2009. Unfortunately we do not have records from either office. Denies weight loss,  constipation, change in stool caliber, melena, hematochezia, nausea, vomiting, dysphagia, reflux symptoms, chest pain.  Review of Systems: Pertinent positive and negative review of systems were noted in the above HPI section. All other review of systems were otherwise negative.  Current Medications, Allergies, Past Medical History, Past Surgical History, Family History and Social History were reviewed in [employer] record.  Physical Exam: General: Well developed , well nourished, no acute distress Head: Normocephalic and atraumatic Eyes:  sclerae anicteric, EOMI Ears: Normal auditory acuity Mouth: No deformity or lesions Neck: Supple, no masses or thyromegaly Lungs: Clear throughout to auscultation Heart: Regular rate and rhythm; no murmurs, rubs or bruits Abdomen: Soft, non tender and non distended. No masses, hepatosplenomegaly or hernias noted. Normal Bowel sounds Rectal: slightly decreased sphincter tone, small external hemorrhoids, no lesions, heme neg soft, brown stool Musculoskeletal: Symmetrical with no gross deformities  Skin: No lesions on visible extremities Pulses:  Normal pulses noted Extremities: No clubbing, cyanosis, edema or deformities noted Neurological: Alert oriented x 4, grossly nonfocal Cervical Nodes:  No significant cervical adenopathy Inguinal Nodes: No significant inguinal adenopathy Psychological:  Alert and cooperative. Normal mood and affect  Assessment and  Recommendations:  1. Urgent postprandial diarrhea, lower abd pain, sensation of incomplete evacuation, occasional incontinence, history of UC. R/O recurrent UC. Send stool studies. Request records from Dr. Edrick Oh and Dr. Laural Golden. Start Azulfidine bid. Imodium bid prn. May need colonoscopy pending review of records and stool study results.   07/11/2013. Records received in the use from Dr. Joya Gaskins. Patient underwent colonoscopy on 04/19/2008 for diarrhea, heme positive stool and anemia. Colitis was noted from the hepatic flexure to the sigmoid colon with sparing of the rectum, cecum and proximal ascending colon and an ulcer at the IC valve. Biopsies of the cecum, transverse colon and left colon showed active chronic colitis consistent with ulcerative colitis. Stool studies were negative.

## 2013-07-04 NOTE — Patient Instructions (Addendum)
Your physician has requested that you go to the basement for the following lab work before leaving today: GI pathogen panel.  We have sent the following medications to your pharmacy for you to pick up at your convenience: Azulfidine 500 mg to take one tablet by mouth twice daily x 5 days, then increase to 2 tablets by mouth twice daily.  Take Imodium over the counter twice daily for diarrhea.   We will obtain your Gastroenterolgy records from Dr. Olevia Perches office.  Thank you for choosing me and [facility].  Pricilla Riffle. Dagoberto Ligas., MD., Marval Regal  cc: Dione Housekeeper, MD

## 2013-07-06 ENCOUNTER — Other Ambulatory Visit: Payer: Medicare Other

## 2013-07-06 DIAGNOSIS — K5190 Ulcerative colitis, unspecified, without complications: Secondary | ICD-10-CM

## 2013-07-06 DIAGNOSIS — R109 Unspecified abdominal pain: Secondary | ICD-10-CM

## 2013-07-06 DIAGNOSIS — R197 Diarrhea, unspecified: Secondary | ICD-10-CM

## 2013-07-09 LAB — GASTROINTESTINAL PATHOGEN PANEL PCR
C. DIFFICILE TOX A/B, PCR: NEGATIVE
CRYPTOSPORIDIUM, PCR: NEGATIVE
Campylobacter, PCR: NEGATIVE
E COLI 0157, PCR: NEGATIVE
E coli (ETEC) LT/ST PCR: NEGATIVE
E coli (STEC) stx1/stx2, PCR: NEGATIVE
GIARDIA LAMBLIA, PCR: NEGATIVE
Norovirus, PCR: NEGATIVE
ROTAVIRUS, PCR: NEGATIVE
SHIGELLA, PCR: NEGATIVE
Salmonella, PCR: NEGATIVE

## 2013-08-14 ENCOUNTER — Ambulatory Visit (INDEPENDENT_AMBULATORY_CARE_PROVIDER_SITE_OTHER): Payer: Medicare Other | Admitting: Internal Medicine

## 2013-08-14 ENCOUNTER — Encounter: Payer: Self-pay | Admitting: Internal Medicine

## 2013-08-14 VITALS — BP 153/78 | HR 74 | Ht 59.0 in | Wt 137.0 lb

## 2013-08-14 DIAGNOSIS — I495 Sick sinus syndrome: Secondary | ICD-10-CM

## 2013-08-14 DIAGNOSIS — Z950 Presence of cardiac pacemaker: Secondary | ICD-10-CM

## 2013-08-14 LAB — MDC_IDC_ENUM_SESS_TYPE_INCLINIC
Brady Statistic RA Percent Paced: 99 %
Brady Statistic RV Percent Paced: 1 %
Date Time Interrogation Session: 20150310103344
Implantable Pulse Generator Model: 5386
Implantable Pulse Generator Serial Number: 1355122
Lead Channel Impedance Value: 434 Ohm
Lead Channel Impedance Value: 462 Ohm
Lead Channel Pacing Threshold Pulse Width: 0.4 ms
Lead Channel Sensing Intrinsic Amplitude: 16.6 mV
Lead Channel Setting Pacing Amplitude: 2 V
Lead Channel Setting Pacing Pulse Width: 0.4 ms
Lead Channel Setting Sensing Sensitivity: 2 mV
MDC IDC MSMT BATTERY IMPEDANCE: 3700 Ohm
MDC IDC MSMT BATTERY VOLTAGE: 2.75 V
MDC IDC MSMT LEADCHNL RA PACING THRESHOLD AMPLITUDE: 0.5 V
MDC IDC MSMT LEADCHNL RA PACING THRESHOLD PULSEWIDTH: 0.4 ms
MDC IDC MSMT LEADCHNL RV PACING THRESHOLD AMPLITUDE: 0.875 V

## 2013-08-14 NOTE — Progress Notes (Signed)
      Patient Care Team: Dione Housekeeper, MD as PCP - General (Family Medicine)   HPI  Paula Walls is a 80 y.o. female is seen following pacemaker implantation for sinus node dysfunction. She has no intrinsic atrial rhythm.  She has normal left ventricular function; by echo in 2008 and catheterization 2009 demonstrated moderate diffuse disease also with normal ejection fraction.  The patient denies SOB, chest pain, edema or palpitations   Her major complaint is numbness in the tips of her fingers bilaterally which is provoked by holding her hand over her 10:00 2:00 configuration;is relieved by putting her hand down.   There Is no associated discoloration.   Past Medical History  Diagnosis Date  . Coronary artery disease   . Chronotropic incompetence   . Hyperlipidemia   . Hypertension   . Hypothyroidism   . Iron deficiency anemia   . Sinoatrial node dysfunction   . MRSA (methicillin resistant Staphylococcus aureus)   . UC (ulcerative colitis)   . Diverticulosis     Past Surgical History  Procedure Laterality Date  . Cardiac catheterization  02/04/2004  . Pacemaker insertion  02/06/2004    St. Jude  . Thyroidectomy, partial      Current Outpatient Prescriptions  Medication Sig Dispense Refill  . levothyroxine (SYNTHROID, LEVOTHROID) 100 MCG tablet Take 100 mcg by mouth daily.      . metoprolol succinate (TOPROL-XL) 50 MG 24 hr tablet Take one tablet by mouth once daily  30 tablet  11  . Red Yeast Rice Extract (RED YEAST RICE PO) Take 600 mg by mouth daily.      Marland Kitchen sulfaSALAzine (AZULFIDINE) 500 MG tablet Take one tablet by mouth twice daily x 5 days.  Then increase to two tablets by mouth twice daily.  110 tablet  0  . valsartan-hydrochlorothiazide (DIOVAN-HCT) 320-25 MG per tablet TAKE ONE TABLET BY MOUTH EVERY DAY  90 tablet  3   No current facility-administered medications for this visit.    Allergies  Allergen Reactions  . Flagyl [Metronidazole Hcl]   .  Metronidazole     Review of Systems negative except from HPI and PMH  Physical Exam BP 153/78  Pulse 74  Ht 4' 11"  (1.499 m)  Wt 137 lb (62.143 kg)  BMI 27.66 kg/m2 Well developed and well nourished in no acute distress HENT normal E scleral and icterus clear Neck Supple JVP flat; carotids brisk and full Clear to ausculation  Device pocket well healed; without hematoma or erythema.  There is no tethering *Regular rate and rhythm, no murmurs gallops or rub Soft with active bowel sounds No clubbing cyanosis none Edema Alert and oriented, grossly normal motor and sensory function; reflexes are intact in the upper extremities bilaterally Skin Warm and Dry   ECG demonstrates atrial pacing at 60 intervals 20/08/43  Assessment and  Plan  Sinus node dysfunction/peripheral incompetence  Pacemaker-St. Jude  The patient's device was interrogated.  The information was reviewed. No changes were made in the programming.     Paresthesias upper extremities bilaterally  Hypertension  The patient's pacemaker is functioning normally with reasonable heart rate excursion.  Her blood pressure remains elevated. She would like to follow this up with Dr. Edrick Oh.  She is also  Seizures in her upper extremities which is positional. I worry about mechanical compression. I have encouraged her to consider neurological referral. She would like to defer this and follow up with Dr. Edrick Oh.

## 2013-08-14 NOTE — Patient Instructions (Signed)
Your physician recommends that you continue on your current medications as directed. Please refer to the Current Medication list given to you today.  Your physician wants you to follow-up in: 1 year with Dr. Caryl Comes.  You will receive a reminder letter in the mail two months in advance. If you don't receive a letter, please call our office to schedule the follow-up appointment.

## 2013-08-17 ENCOUNTER — Other Ambulatory Visit: Payer: Self-pay | Admitting: Internal Medicine

## 2013-08-21 ENCOUNTER — Encounter: Payer: Self-pay | Admitting: Internal Medicine

## 2013-10-24 DIAGNOSIS — I495 Sick sinus syndrome: Secondary | ICD-10-CM

## 2013-12-19 ENCOUNTER — Encounter: Payer: Self-pay | Admitting: Internal Medicine

## 2014-02-25 ENCOUNTER — Encounter: Payer: Self-pay | Admitting: Internal Medicine

## 2014-02-25 DIAGNOSIS — I495 Sick sinus syndrome: Secondary | ICD-10-CM

## 2014-03-20 ENCOUNTER — Other Ambulatory Visit: Payer: Self-pay | Admitting: Internal Medicine

## 2014-04-05 ENCOUNTER — Other Ambulatory Visit: Payer: Self-pay | Admitting: Internal Medicine

## 2014-04-24 ENCOUNTER — Other Ambulatory Visit: Payer: Self-pay | Admitting: Internal Medicine

## 2014-05-27 ENCOUNTER — Encounter: Payer: Self-pay | Admitting: Internal Medicine

## 2014-05-27 DIAGNOSIS — I495 Sick sinus syndrome: Secondary | ICD-10-CM

## 2014-08-20 ENCOUNTER — Ambulatory Visit (INDEPENDENT_AMBULATORY_CARE_PROVIDER_SITE_OTHER): Payer: Medicare Other | Admitting: Internal Medicine

## 2014-08-20 ENCOUNTER — Encounter: Payer: Self-pay | Admitting: Internal Medicine

## 2014-08-20 VITALS — BP 126/50 | HR 60 | Ht 60.0 in | Wt 145.0 lb

## 2014-08-20 DIAGNOSIS — I10 Essential (primary) hypertension: Secondary | ICD-10-CM | POA: Diagnosis not present

## 2014-08-20 DIAGNOSIS — I495 Sick sinus syndrome: Secondary | ICD-10-CM

## 2014-08-20 DIAGNOSIS — Z45018 Encounter for adjustment and management of other part of cardiac pacemaker: Secondary | ICD-10-CM

## 2014-08-20 LAB — MDC_IDC_ENUM_SESS_TYPE_INCLINIC
Battery Voltage: 2.73 V
Brady Statistic RA Percent Paced: 99 %
Implantable Pulse Generator Model: 5386
Implantable Pulse Generator Serial Number: 1355122
Lead Channel Impedance Value: 424 Ohm
Lead Channel Impedance Value: 455 Ohm
Lead Channel Pacing Threshold Amplitude: 0.75 V
Lead Channel Pacing Threshold Pulse Width: 0.4 ms
Lead Channel Pacing Threshold Pulse Width: 0.4 ms
Lead Channel Setting Pacing Pulse Width: 0.4 ms
Lead Channel Setting Sensing Sensitivity: 2 mV
MDC IDC MSMT LEADCHNL RA PACING THRESHOLD AMPLITUDE: 0.5 V
MDC IDC MSMT LEADCHNL RV SENSING INTR AMPL: 12.5 mV
MDC IDC SET LEADCHNL RA PACING AMPLITUDE: 2 V
MDC IDC STAT BRADY RV PERCENT PACED: 1 % — AB

## 2014-08-20 NOTE — Patient Instructions (Signed)
Your physician recommends that you continue on your current medications as directed. Please refer to the Current Medication list given to you today.  Your physician wants you to follow-up in: 6 months with device clinic.  You will receive a reminder letter in the mail two months in advance. If you don't receive a letter, please call our office to schedule the follow-up appointment.  Your physician wants you to follow-up in: 1 year with Dr. Caryl Comes.  You will receive a reminder letter in the mail two months in advance. If you don't receive a letter, please call our office to schedule the follow-up appointment.  Therapist name is Helene Kelp

## 2014-08-20 NOTE — Progress Notes (Signed)
Patient Care Team: Dione Housekeeper, MD as PCP - General (Family Medicine)   HPI  Paula Walls is a 81 y.o. female is seen following pacemaker implantation for sinus node dysfunction. She has no intrinsic atrial rhythm.  She has normal left ventricular function; by echo in 2008 and catheterization 2009 demonstrated moderate diffuse disease also with normal ejection fraction.  The patient denies SOB, chest pain, edema or palpitations   Her major complaint is numbness in the tips of her fingers bilaterally which is provoked by holding her hand over her 10:00 2:00 configuration;is relieved by putting her hand down.   There Is no associated discoloration.   Past Medical History  Diagnosis Date  . Coronary artery disease   . Chronotropic incompetence   . Hyperlipidemia   . Hypertension   . Hypothyroidism   . Iron deficiency anemia   . Sinoatrial node dysfunction   . MRSA (methicillin resistant Staphylococcus aureus)   . UC (ulcerative colitis)   . Diverticulosis     Past Surgical History  Procedure Laterality Date  . Cardiac catheterization  02/04/2004  . Pacemaker insertion  02/06/2004    St. Jude  . Thyroidectomy, partial      Current Outpatient Prescriptions  Medication Sig Dispense Refill  . levothyroxine (SYNTHROID, LEVOTHROID) 100 MCG tablet Take 100 mcg by mouth daily.    . metoprolol succinate (TOPROL-XL) 50 MG 24 hr tablet TAKE ONE TABLET BY MOUTH ONCE DAILY 30 tablet 5  . Red Yeast Rice Extract (RED YEAST RICE PO) Take 600 mg by mouth as needed (for leg cramps).     Marland Kitchen sulfaSALAzine (AZULFIDINE) 500 MG tablet Take one tablet by mouth twice daily x 5 days.  Then increase to two tablets by mouth twice daily. (Patient taking differently: Take 500 mg by mouth as needed (upset stomach). ) 110 tablet 0  . valsartan-hydrochlorothiazide (DIOVAN-HCT) 320-25 MG per tablet TAKE ONE TABLET BY MOUTH ONCE DAILY 90 tablet 1   No current facility-administered medications for  this visit.    Allergies  Allergen Reactions  . Flagyl [Metronidazole Hcl]   . Metronidazole     Review of Systems negative except from HPI and PMH  Physical Exam BP 126/50 mmHg  Pulse 60  Ht 5' (1.524 m)  Wt 145 lb (65.772 kg)  BMI 28.32 kg/m2 Well developed and well nourished in no acute distress HENT normal E scleral and icterus clear Neck Supple JVP flat; carotids brisk and full Clear to ausculation  Device pocket well healed; without hematoma or erythema.  There is no tethering *Regular rate and rhythm, no murmurs gallops or rub Soft with active bowel sounds No clubbing cyanosis none Edema Alert and oriented, grossly normal motor and sensory function; reflexes are intact in the upper extremities bilaterally Decreases shoulder mobility Skin Warm and Dry   ECG demonstrates atrial pacing at 60 intervals 20/08/43  Assessment and  Plan  Sinus node dysfunction/ chronotropic incompetence  The patient's device was interrogated.  The information was reviewed. No changes were made in the programming.     Pacemaker-St. Jude  The patient's device was interrogated.  The information was reviewed. No changes were made in the programming.    Hypertension well controlled  Shoulder mobility and weakness  We have discussed the importance of thrying to get shoulder problems addressed  I have given her the name of Paula Walls  If she needs referral will be glad to sign   We spent more than  50% of our >25 min visit in face to face counseling regarding the above

## 2014-08-28 ENCOUNTER — Encounter: Payer: Self-pay | Admitting: Internal Medicine

## 2014-10-26 ENCOUNTER — Other Ambulatory Visit: Payer: Self-pay | Admitting: Internal Medicine

## 2014-11-01 ENCOUNTER — Other Ambulatory Visit: Payer: Self-pay | Admitting: *Deleted

## 2014-11-01 MED ORDER — METOPROLOL SUCCINATE ER 50 MG PO TB24
50.0000 mg | ORAL_TABLET | Freq: Every day | ORAL | Status: DC
Start: 2014-11-01 — End: 2015-05-21

## 2014-11-26 DIAGNOSIS — I495 Sick sinus syndrome: Secondary | ICD-10-CM | POA: Diagnosis not present

## 2015-02-19 ENCOUNTER — Ambulatory Visit (INDEPENDENT_AMBULATORY_CARE_PROVIDER_SITE_OTHER): Payer: Medicare Other | Admitting: *Deleted

## 2015-02-19 DIAGNOSIS — I495 Sick sinus syndrome: Secondary | ICD-10-CM | POA: Diagnosis not present

## 2015-02-19 DIAGNOSIS — Z950 Presence of cardiac pacemaker: Secondary | ICD-10-CM | POA: Diagnosis not present

## 2015-02-19 LAB — CUP PACEART INCLINIC DEVICE CHECK
Battery Impedance: 8000 Ohm
Battery Voltage: 2.71 V
Brady Statistic RA Percent Paced: 99 %
Lead Channel Impedance Value: 467 Ohm
Lead Channel Pacing Threshold Amplitude: 0.5 V
Lead Channel Pacing Threshold Amplitude: 1 V
Lead Channel Pacing Threshold Pulse Width: 0.4 ms
Lead Channel Setting Pacing Amplitude: 2 V
Lead Channel Setting Sensing Sensitivity: 2 mV
MDC IDC MSMT LEADCHNL RA IMPEDANCE VALUE: 475 Ohm
MDC IDC MSMT LEADCHNL RA PACING THRESHOLD PULSEWIDTH: 0.4 ms
MDC IDC MSMT LEADCHNL RV SENSING INTR AMPL: 13.9 mV
MDC IDC SESS DTM: 20160914121225
MDC IDC SET LEADCHNL RV PACING PULSEWIDTH: 0.4 ms
MDC IDC STAT BRADY RV PERCENT PACED: 1 % — AB
Pulse Gen Serial Number: 1355122

## 2015-02-19 NOTE — Progress Notes (Signed)
Pacemaker check in clinic. Normal device function. Thresholds, sensing, impedances consistent with previous measurements. Device programmed to maximize longevity. No mode switch or high ventricular rates noted. Device programmed at appropriate safety margins. Histogram distribution appropriate for patient activity level. Device programmed to optimize intrinsic conduction. Estimated longevity 1.25-1.5 years. ROV with SK in March. Pt had an eye exam with abnormal findings- report with pt's daughter recommends R carotid evaluation. Copies made of report and will have Trinidad Curet, RN arrange follow up.

## 2015-02-28 ENCOUNTER — Telehealth: Payer: Self-pay | Admitting: Internal Medicine

## 2015-02-28 DIAGNOSIS — H34211 Partial retinal artery occlusion, right eye: Secondary | ICD-10-CM

## 2015-02-28 NOTE — Telephone Encounter (Signed)
New Message       Pt's daughter calling stating that the pt's eye doctor wanted pt to get in to see Dr. Caryl Comes as soon as possible due to finding some plaque near the pt's eye if it gets into something it could cause a stroke. Pt's daughter states when they were here last week for pt's appt w/ the device clinic they told Terrence Dupont and gave her the report from the other doctor and were told that they would get a call back about setting up an appt asap w/ Dr. Caryl Comes. Pt's daughter states this is pretty urgent. Please call back and advise.

## 2015-03-03 NOTE — Telephone Encounter (Signed)
Follow up message    Pt's daughter calling again stating that the pt's eye doctor wanted pt to get in to see Dr. Caryl Comes as soon as possible due to finding some plaque near the pt's eye if it gets into something it could cause a stroke. Pt's daughter states when they were here last week for pt's appt w/ the device clinic they told Paula Walls and gave her the report from the other doctor and were told that they would get a call back about setting up an appt asap w/ Dr. Caryl Comes. Pt's daughter states this is pretty urgent. Please call back and advise.

## 2015-03-03 NOTE — Telephone Encounter (Signed)
I left a message for the patient to call at her home #- need her consent to speak with her daughter as I do not see a DPR on file. Dr. Caryl Comes did receive an office note from the patient's opthomologist stating that there was concern for [place] Plaque and that it was recommended she have a work up of her carotid arteries. Per Dr. 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 This will be helpful to have this study done prior to scheduling follow up with Dr. Caryl Comes.

## 2015-03-04 NOTE — Telephone Encounter (Signed)
F/u   Pt returning call to nurse from yesterday. Please call pt back

## 2015-03-04 NOTE — Telephone Encounter (Signed)
Pt gave verbal okay to speak with daughter Paula Walls about medical issues/concerns and that she will complete next OV.   Pt to agrees to have carotid dopplers completed and given her our [facility] office address.  Pt also know that appt will be setup with Dr. Caryl Comes after her test are completed and evaluated by Dr. Caryl Comes.   Pt scheduled to have carotids evaluated on 9/30. Pt made aware, no questions at this time.

## 2015-03-05 ENCOUNTER — Other Ambulatory Visit: Payer: Self-pay | Admitting: Internal Medicine

## 2015-03-05 DIAGNOSIS — H34211 Partial retinal artery occlusion, right eye: Secondary | ICD-10-CM

## 2015-03-05 DIAGNOSIS — H539 Unspecified visual disturbance: Secondary | ICD-10-CM

## 2015-03-07 ENCOUNTER — Ambulatory Visit (HOSPITAL_COMMUNITY)
Admission: RE | Admit: 2015-03-07 | Discharge: 2015-03-07 | Disposition: A | Discharge status: Home / Self Care | Payer: Medicare Other | Source: Ambulatory Visit | Attending: Cardiology | Admitting: Cardiology

## 2015-03-07 DIAGNOSIS — I6523 Occlusion and stenosis of bilateral carotid arteries: Secondary | ICD-10-CM | POA: Insufficient documentation

## 2015-03-07 DIAGNOSIS — H539 Unspecified visual disturbance: Secondary | ICD-10-CM

## 2015-03-07 DIAGNOSIS — H34211 Partial retinal artery occlusion, right eye: Secondary | ICD-10-CM

## 2015-03-07 DIAGNOSIS — E785 Hyperlipidemia, unspecified: Secondary | ICD-10-CM | POA: Insufficient documentation

## 2015-03-07 DIAGNOSIS — I10 Essential (primary) hypertension: Secondary | ICD-10-CM | POA: Insufficient documentation

## 2015-03-12 ENCOUNTER — Telehealth: Payer: Self-pay | Admitting: Internal Medicine

## 2015-03-12 NOTE — Telephone Encounter (Signed)
New problem   Pt want to know results of her [place]. Please call pt.

## 2015-03-12 NOTE — Telephone Encounter (Signed)
I spoke with the patient and she is aware of her results.

## 2015-03-28 ENCOUNTER — Encounter: Payer: Self-pay | Admitting: Internal Medicine

## 2015-05-21 ENCOUNTER — Other Ambulatory Visit: Payer: Self-pay | Admitting: Internal Medicine

## 2015-05-21 DIAGNOSIS — I495 Sick sinus syndrome: Secondary | ICD-10-CM | POA: Diagnosis not present

## 2015-08-20 ENCOUNTER — Encounter: Payer: Self-pay | Admitting: Internal Medicine

## 2015-08-20 DIAGNOSIS — I495 Sick sinus syndrome: Secondary | ICD-10-CM | POA: Diagnosis not present

## 2015-08-22 ENCOUNTER — Other Ambulatory Visit: Payer: Self-pay | Admitting: Internal Medicine

## 2015-09-16 ENCOUNTER — Encounter: Payer: Self-pay | Admitting: Internal Medicine

## 2015-09-16 ENCOUNTER — Ambulatory Visit (INDEPENDENT_AMBULATORY_CARE_PROVIDER_SITE_OTHER): Payer: Medicare Other | Admitting: Internal Medicine

## 2015-09-16 VITALS — BP 142/78 | Ht 60.0 in | Wt 144.8 lb

## 2015-09-16 DIAGNOSIS — I495 Sick sinus syndrome: Secondary | ICD-10-CM

## 2015-09-16 DIAGNOSIS — Z950 Presence of cardiac pacemaker: Secondary | ICD-10-CM | POA: Diagnosis not present

## 2015-09-16 DIAGNOSIS — I4589 Other specified conduction disorders: Secondary | ICD-10-CM | POA: Diagnosis not present

## 2015-09-16 DIAGNOSIS — R079 Chest pain, unspecified: Secondary | ICD-10-CM | POA: Diagnosis not present

## 2015-09-16 LAB — CUP PACEART INCLINIC DEVICE CHECK
Battery Impedance: 16300 Ohm
Implantable Lead Location: 753860
Lead Channel Impedance Value: 462 Ohm
Lead Channel Pacing Threshold Pulse Width: 0.4 ms
Lead Channel Pacing Threshold Pulse Width: 0.4 ms
Lead Channel Sensing Intrinsic Amplitude: 16.1 mV
Lead Channel Setting Sensing Sensitivity: 2 mV
MDC IDC LEAD IMPLANT DT: 20050901
MDC IDC LEAD IMPLANT DT: 20050901
MDC IDC LEAD LOCATION: 753859
MDC IDC MSMT BATTERY REMAINING LONGEVITY: 6
MDC IDC MSMT BATTERY VOLTAGE: 2.65 V
MDC IDC MSMT LEADCHNL RA IMPEDANCE VALUE: 470 Ohm
MDC IDC MSMT LEADCHNL RA PACING THRESHOLD AMPLITUDE: 0.5 V
MDC IDC MSMT LEADCHNL RV PACING THRESHOLD AMPLITUDE: 0.625 V
MDC IDC SESS DTM: 20170411154053
MDC IDC SET LEADCHNL RA PACING AMPLITUDE: 2 V
MDC IDC SET LEADCHNL RV PACING PULSEWIDTH: 0.4 ms
Pulse Gen Serial Number: 1355122

## 2015-09-16 MED ORDER — FUROSEMIDE 20 MG PO TABS: 20.0000 mg | ORAL_TABLET | Freq: Every day | ORAL | Status: DC

## 2015-09-16 MED ORDER — VALSARTAN 320 MG PO TABS: 320.0000 mg | ORAL_TABLET | Freq: Every day | ORAL | Status: DC

## 2015-09-16 MED ORDER — AMLODIPINE BESYLATE 2.5 MG PO TABS: 2.5000 mg | ORAL_TABLET | Freq: Every day | ORAL | Status: DC

## 2015-09-16 NOTE — Progress Notes (Signed)
Patient Care Team: Dione Housekeeper, MD as PCP - General (Family Medicine)   HPI  Paula Walls is a 82 y.o. female is seen following pacemaker implantation for sinus node dysfunction. She has no intrinsic atrial rhythm.  She has normal left ventricular function; by echo in 2008 and catheterization 2009 demonstrated moderate diffuse disease also with normal ejection fraction.  Catheterization 2005 described high grade LAD diagonal bifurcation disease The patient complains of DOE and esertional chest pressure which are both relieved by rest.   She has some PND but thinks it relates to her allergies, no orthopnea         Past Medical History  Diagnosis Date  . Coronary artery disease   . Chronotropic incompetence   . Hyperlipidemia   . Hypertension   . Hypothyroidism   . Iron deficiency anemia   . Sinoatrial node dysfunction (HCC)   . MRSA (methicillin resistant Staphylococcus aureus)   . UC (ulcerative colitis) ([place])   . Diverticulosis     Past Surgical History  Procedure Laterality Date  . Cardiac catheterization  02/04/2004  . Pacemaker insertion  02/06/2004    St. Jude  . Thyroidectomy, partial      Current Outpatient Prescriptions  Medication Sig Dispense Refill  . levothyroxine (SYNTHROID, LEVOTHROID) 100 MCG tablet Take 100 mcg by mouth daily.    . metoprolol succinate (TOPROL-XL) 50 MG 24 hr tablet TAKE ONE TABLET BY MOUTH ONCE DAILY WITH  OR  INNEDIATELY  FOLLOWING  A  MEAL 30 tablet 11  . valsartan-hydrochlorothiazide (DIOVAN-HCT) 320-25 MG per tablet TAKE ONE TABLET BY MOUTH ONCE DAILY 90 tablet 1  . Red Yeast Rice Extract (RED YEAST RICE PO) Take 600 mg by mouth as needed (for leg cramps).     Marland Kitchen sulfaSALAzine (AZULFIDINE) 500 MG tablet Take one tablet by mouth twice daily x 5 days.  Then increase to two tablets by mouth twice daily. (Patient not taking: Reported on 02/19/2015) 110 tablet 0   No current facility-administered medications for this visit.     Allergies  Allergen Reactions  . Flagyl [Metronidazole Hcl] Other (See Comments)    Extreme itch in head. Resembles lice  . Metronidazole Itching    In head    Review of Systems negative except from HPI and PMH  Physical Exam BP 192/90 mmHg  Ht 5' (1.524 m)  Wt 144 lb 12.8 oz (65.681 kg)  BMI 28.28 kg/m2  SpO2 98% Well developed and well nourished in no acute distress HENT normal E scleral and icterus clear Neck Supple JVP flat; carotids brisk and full Clear to ausculation  Device pocket well healed; without hematoma or erythema.  There is no tethering *Regular rate and rhythm, no murmurs gallops or rub Soft with active bowel sounds No clubbing cyanosis none Edema Alert and oriented, grossly normal motor and sensory function; reflexes are intact in the upper extremities bilaterally Decreases shoulder mobility Skin Warm and Dry   ECG demonstrates atrial pacing at 60 intervals 20/08/43  Assessment and  Plan  Sinus node dysfunction/ chronotropic incompetence  The patient's device was interrogated.  The information was reviewed. No changes were made in the programming.     Pacemaker-St. Jude  The patient's device was interrogated.  The information was reviewed. No changes were made in the programming.    Hypertension  Poorly controlled  Chest pain  coronary disease diffuse  HFpEF   Her shoulder is better.  I am concerned that her dyspnea  and chest discomfort may be a manifestation of progressive coronary disease noted catheterization  We will undertake myoveiw scan  We will  Change her antihypertensive adding amlodipine and add furosemide in exchange for thiazide for her HFpEF.  Encouraged low salt diet

## 2015-09-16 NOTE — Patient Instructions (Addendum)
Medication Instructions: 1) Start norvasc (amlodipine) 2.5 mg one tablet by mouth once daily 2) Start lasix (furosemide) 20 mg one tablet by mouth once daily 3) Stop valsartan-hctz 4) Start valsartan 320 mg one tablet by mouth once daily  Labwork: - none  Procedures/Testing: - Your physician has requested that you have a lexiscan myoview. For further information please visit [email or web address]. Please follow instruction sheet, as given.  Follow-Up: - Your physician wants you to follow-up in: 6 months with the [place] 1 year with Dr. Caryl Comes. You will receive a reminder letter in the mail two months in advance. If you don't receive a letter, please call our office to schedule the follow-up appointment.   Any Additional Special Instructions Will Be Listed Below (If Applicable). - Low salt diet    If you need a refill on your cardiac medications before your next appointment, please call your pharmacy.

## 2015-09-17 NOTE — Addendum Note (Signed)
Addended by: Freada Bergeron on: 09/17/2015 05:29 PM   Modules accepted: Orders

## 2015-09-18 ENCOUNTER — Telehealth (HOSPITAL_COMMUNITY): Payer: Self-pay | Admitting: *Deleted

## 2015-09-18 NOTE — Telephone Encounter (Signed)
Patient given detailed instructions per Myocardial Perfusion Study Information Sheet for the test on 09/24/15. Patient notified to arrive 15 minutes early and that it is imperative to arrive on time for appointment to keep from having the test rescheduled.  If you need to cancel or reschedule your appointment, please call the office within 24 hours of your appointment. Failure to do so may result in a cancellation of your appointment, and a $50 no show fee. Patient verbalized understanding. Hubbard Robinson, RN

## 2015-09-24 ENCOUNTER — Ambulatory Visit (HOSPITAL_COMMUNITY): Payer: Medicare Other | Attending: Cardiovascular Disease

## 2015-09-24 DIAGNOSIS — I779 Disorder of arteries and arterioles, unspecified: Secondary | ICD-10-CM | POA: Insufficient documentation

## 2015-09-24 DIAGNOSIS — R0609 Other forms of dyspnea: Secondary | ICD-10-CM | POA: Diagnosis not present

## 2015-09-24 DIAGNOSIS — R079 Chest pain, unspecified: Secondary | ICD-10-CM | POA: Diagnosis present

## 2015-09-24 DIAGNOSIS — I10 Essential (primary) hypertension: Secondary | ICD-10-CM | POA: Diagnosis not present

## 2015-09-24 DIAGNOSIS — R9439 Abnormal result of other cardiovascular function study: Secondary | ICD-10-CM | POA: Insufficient documentation

## 2015-09-24 LAB — MYOCARDIAL PERFUSION IMAGING
CHL CUP NUCLEAR SDS: 3
CHL CUP NUCLEAR SRS: 4
CHL CUP RESTING HR STRESS: 60 {beats}/min
LV sys vol: 20 mL
LVDIAVOL: 63 mL (ref 46–106)
Peak HR: 61 {beats}/min
RATE: 0.36
SSS: 7
TID: 1.02

## 2015-09-24 MED ORDER — REGADENOSON 0.4 MG/5ML IV SOLN
0.4000 mg | Freq: Once | INTRAVENOUS | Status: AC
  Administered 2015-09-24: 0.4 mg via INTRAVENOUS

## 2015-09-24 MED ORDER — TECHNETIUM TC 99M SESTAMIBI GENERIC - CARDIOLITE
30.7000 | Freq: Once | INTRAVENOUS | Status: AC | PRN
  Administered 2015-09-24: 30.7 via INTRAVENOUS

## 2015-09-24 MED ORDER — TECHNETIUM TC 99M SESTAMIBI GENERIC - CARDIOLITE
10.2000 | Freq: Once | INTRAVENOUS | Status: AC | PRN
  Administered 2015-09-24: 10 via INTRAVENOUS

## 2015-11-14 ENCOUNTER — Telehealth: Payer: Self-pay | Admitting: Internal Medicine

## 2015-11-14 NOTE — Telephone Encounter (Signed)
Follow-up     the pt is calling back to speak with the nurse, the pt states she has to go the bank with in the next few minutes, please call at [phone]

## 2015-11-14 NOTE — Telephone Encounter (Signed)
Attempted to call daughter Abigail Butts - unable to reach Spoke with patient She woke up around 3am yesterday June 8 w/nasal congestion -- checked BP around 5am - 190/78 (HR 59) -- checked BP around 8am - 151/69 (HR 60) (after meds) -- checked BP around 9pm - 129/58 (HR 59)  Today June 9 - BP reading 0900 192/74 (HR 62) - before medications  Patient is taking toprol XL, amlodipine, valsartan all in AM after she eats - wonder if she should take one bed QHS?  Patient states he does not have a headache but it feels like one is coming - no other complaints - she feels normal even when BP is elevated  Will defer to clinical pharmacy staff to assist with BP

## 2015-11-14 NOTE — Telephone Encounter (Signed)
Patient called w/advice. She voiced understanding & agreed with plan. Advised patient to continue to monitor BP and call if she does not notice changes in her BP readings.   Med list updated to reflect this change.

## 2015-11-14 NOTE — Telephone Encounter (Signed)
Spoke with L. Dorene Ar, NP She would advise patient to take amlodipine 2.79m QHS instead of all BP meds QAM She would advise patient to go ahead and start this tonite (will make 2 doses of amlodipine today 6/9)

## 2015-11-14 NOTE — Telephone Encounter (Signed)
New message  Pt c/o BP issue:  1. What are your last 5 BP readings?   Yesterday at 3 am woke up feeling weird and cold. BP was 190/78  11/14/2015 @ 8 am 192/74   2. Are you having any other symptoms (ex. Dizziness, headache, blurred vision, passed out)? No    3. What is your medication issue? Pt daughter states the pt's medications on 09/16/2015 was changed and they were hoping that it would bring it down but the daughter is pretty concerned because the pt's BP is fairly high early in the AM

## 2015-11-19 DIAGNOSIS — I495 Sick sinus syndrome: Secondary | ICD-10-CM | POA: Diagnosis not present

## 2015-11-20 ENCOUNTER — Telehealth: Payer: Self-pay | Admitting: *Deleted

## 2015-11-20 NOTE — Telephone Encounter (Signed)
LMOM to call back. Needs device clinic appt in July for billable battery check.

## 2015-11-24 NOTE — Telephone Encounter (Signed)
LMOM to call back to device clinic.

## 2015-12-10 NOTE — Telephone Encounter (Signed)
Sent message to scheduling to schedule device clinic appt for battery longevity check.

## 2015-12-15 ENCOUNTER — Encounter: Payer: Self-pay | Admitting: Internal Medicine

## 2015-12-17 ENCOUNTER — Encounter: Payer: Medicare Other | Admitting: *Deleted

## 2015-12-17 ENCOUNTER — Ambulatory Visit (INDEPENDENT_AMBULATORY_CARE_PROVIDER_SITE_OTHER): Payer: Medicare Other | Admitting: *Deleted

## 2015-12-17 DIAGNOSIS — I495 Sick sinus syndrome: Secondary | ICD-10-CM | POA: Diagnosis not present

## 2015-12-17 LAB — CUP PACEART INCLINIC DEVICE CHECK
Battery Remaining Longevity: 3
Implantable Lead Implant Date: 20050901
Lead Channel Pacing Threshold Amplitude: 0.75 V
Lead Channel Sensing Intrinsic Amplitude: 12.2 mV
Lead Channel Setting Pacing Pulse Width: 0.4 ms
Lead Channel Setting Sensing Sensitivity: 2 mV
MDC IDC LEAD IMPLANT DT: 20050901
MDC IDC LEAD LOCATION: 753859
MDC IDC LEAD LOCATION: 753860
MDC IDC MSMT BATTERY IMPEDANCE: 23800 Ohm
MDC IDC MSMT BATTERY VOLTAGE: 2.6 V
MDC IDC MSMT LEADCHNL RA IMPEDANCE VALUE: 464 Ohm
MDC IDC MSMT LEADCHNL RA PACING THRESHOLD AMPLITUDE: 0.5 V
MDC IDC MSMT LEADCHNL RA PACING THRESHOLD PULSEWIDTH: 0.4 ms
MDC IDC MSMT LEADCHNL RV IMPEDANCE VALUE: 473 Ohm
MDC IDC MSMT LEADCHNL RV PACING THRESHOLD PULSEWIDTH: 0.4 ms
MDC IDC SESS DTM: 20170712114928
MDC IDC SET LEADCHNL RA PACING AMPLITUDE: 2 V
MDC IDC STAT BRADY RA PERCENT PACED: 99 % — AB
MDC IDC STAT BRADY RV PERCENT PACED: 1 % — AB
Pulse Gen Serial Number: 1355122

## 2015-12-17 NOTE — Progress Notes (Signed)
Pacemaker check in clinic. Normal device function. Thresholds, sensing, impedances consistent with previous measurements. Device programmed to maximize longevity. (1) mode switch episode x 5 mins, Max A 591--no EGM. No high ventricular rates noted. Device programmed at appropriate safety margins. Histogram distribution appropriate for patient activity level. Device programmed to optimize intrinsic conduction. Estimated longevity 0.25 years. Patient will continue [employer] follow up Qmo. and f/u with Device Clinic in 2 months (N/C).

## 2016-01-17 ENCOUNTER — Encounter: Payer: Self-pay | Admitting: Internal Medicine

## 2016-01-19 ENCOUNTER — Telehealth: Payer: Self-pay | Admitting: Internal Medicine

## 2016-01-19 NOTE — Telephone Encounter (Signed)
Pt wanted an appt,,did not need an encounter.

## 2016-01-19 NOTE — Telephone Encounter (Signed)
Contacted Ms. Paula Walls. Scheduled Ms. Paula Walls for a PPM check Wednesday 8/16 at 10am for battery check due to weakness/fatigue. Previously scheduled with Dr. Caryl Comes 02/10/16- I will leave this appt in place.

## 2016-01-19 NOTE — Telephone Encounter (Signed)
Called patient's daughter (DPR), Precious Gilding, back at  [phone]. Patient's daughter stated that Pacemaker battery is due to be replaced in September. Patient's daughter stated patient has no energy, SOB, and having trouble with ADL's. Patient denies any chest pain. Patient's daughter stated patient has been feeling this way for the past 6 months, but seems to be getting worse since Friday. Patient thinks this is due to her pacemaker. Will forward to device clinic. Patient's daughter stated patient had talked to someone over the weekend. Patient also wants Dr. Caryl Comes to be aware. Will forward to Dr. Caryl Comes and his nurse as well.

## 2016-01-19 NOTE — Telephone Encounter (Signed)
Mrs. Paula Walls is calling because she is having some shortness of breath . Mrs . Paula Walls is at work and ask that you  please call her daughter at [phone] and she will get in touch w/ Mrs. Paula Walls.  Thanks

## 2016-01-21 ENCOUNTER — Ambulatory Visit (INDEPENDENT_AMBULATORY_CARE_PROVIDER_SITE_OTHER): Payer: Medicare Other | Admitting: *Deleted

## 2016-01-21 DIAGNOSIS — I495 Sick sinus syndrome: Secondary | ICD-10-CM

## 2016-01-21 DIAGNOSIS — I4589 Other specified conduction disorders: Secondary | ICD-10-CM

## 2016-01-21 LAB — CUP PACEART INCLINIC DEVICE CHECK
Battery Impedance: 27100 Ohm
Battery Voltage: 2.6 V
Date Time Interrogation Session: 20170816151729
Implantable Lead Implant Date: 20050901
Implantable Lead Location: 753860
Lead Channel Setting Sensing Sensitivity: 2 mV
MDC IDC LEAD IMPLANT DT: 20050901
MDC IDC LEAD LOCATION: 753859
MDC IDC MSMT BATTERY REMAINING LONGEVITY: 3
MDC IDC MSMT LEADCHNL RA IMPEDANCE VALUE: 445 Ohm
MDC IDC MSMT LEADCHNL RV IMPEDANCE VALUE: 437 Ohm
MDC IDC PG SERIAL: 1355122
MDC IDC SET LEADCHNL RA PACING AMPLITUDE: 2 V
MDC IDC SET LEADCHNL RV PACING AMPLITUDE: 1.75 V
MDC IDC SET LEADCHNL RV PACING PULSEWIDTH: 0.4 ms

## 2016-01-21 NOTE — Progress Notes (Signed)
Device check in clinic for battery estimate and patient c/o fatigue. ERI reached on 01/15/16, which d/c'd rate response. No episodes recorded. Patient will follow up with SK on 8/18 @ 1000.

## 2016-01-22 ENCOUNTER — Encounter: Payer: Self-pay | Admitting: Internal Medicine

## 2016-01-22 NOTE — H&P (Signed)
Patient Care Team: Dione Housekeeper, MD as PCP - General (Family Medicine)   HPI  Paula Walls is a 82 y.o. female is seen following pacemaker implantation for sinus node dysfunction. She has no intrinsic atrial rhythm. Her device has reached ERI  She also has HFpEF. At her last visit we changed her diuretics.  She has normal left ventricular function; by echo in 2008 and catheterization 2009 demonstrated moderate diffuse disease also with normal ejection fraction.  Catheterization 2005 described high grade LAD diagonal bifurcation disease.  Myoview scan 2016 demonstrated no significant ischemia;  EF 66%   She continues to complain of dyspnea on exertion associated with a vague chest heaviness. This is been present for months and months and gradually worsening. There is been no significant change since she reached ERI a week ago.            Past Medical History:  Diagnosis Date  . Chronotropic incompetence   . Coronary artery disease   . Diverticulosis   . Hyperlipidemia   . Hypertension   . Hypothyroidism   . Iron deficiency anemia   . MRSA (methicillin resistant Staphylococcus aureus)   . Sinoatrial node dysfunction (HCC)   . UC (ulcerative colitis) [facility])     Past Surgical History:  Procedure Laterality Date  . CARDIAC CATHETERIZATION  02/04/2004  . PACEMAKER INSERTION  02/06/2004   St. Jude  . THYROIDECTOMY, PARTIAL      Current Outpatient Prescriptions  Medication Sig Dispense Refill  . amLODipine (NORVASC) 2.5 MG tablet Take 2.5 mg by mouth at bedtime.    . furosemide (LASIX) 20 MG tablet Take 1 tablet (20 mg total) by mouth daily. 90 tablet 3  . levothyroxine (SYNTHROID, LEVOTHROID) 100 MCG tablet Take 100 mcg by mouth daily.    . metoprolol succinate (TOPROL-XL) 50 MG 24 hr tablet TAKE ONE TABLET BY MOUTH ONCE DAILY WITH  OR  INNEDIATELY  FOLLOWING  A  MEAL 30 tablet 11  . valsartan (DIOVAN) 320 MG tablet Take 1 tablet (320 mg total) by mouth daily. 90  tablet 3   No current facility-administered medications for this visit.     Allergies  Allergen Reactions  . Flagyl [Metronidazole Hcl] Other (See Comments)    Extreme itch in head. Resembles lice  . Metronidazole Itching    In head    Review of Systems negative except from HPI and PMH  Physical Exam BP 140/70   Pulse (!) 54   Ht 5' (1.524 m)   Wt 142 lb 12.8 oz (64.8 kg)   BMI 27.89 kg/m  Well developed and well nourished in no acute distress HENT normal E scleral and icterus clear Neck Supple JVP 8; carotids brisk and full Clear to ausculation  Device pocket well healed; without hematoma or erythema.  There is no tethering *Regular rate and rhythm, no murmurs gallops or rub Soft with active bowel sounds No clubbing cyanosis no  Edema Alert and oriented, grossly normal motor and sensory function; reflexes are intact in the upper extremities bilaterally Decreases shoulder mobility Skin Warm and Dry   ECG demonstrates atrial pacing at 52  intervals 20/08/43  Assessment and  Plan  Sinus node dysfunction/ chronotropic incompetence  The patient's device was interrogated.  The information was reviewed. No changes were made in the programming.    Pacemaker-St. Jude  The patient's device was interrogated.  The information was reviewed. No changes were made in the programming.    Hypertension  Adequately controlled  Chest pain and dyspnea  Coronary disease diffuse  HFpEF   Her device has reached replacement.  We have reviewed the benefits and risks of generator replacement.  These include but are not limited to lead fracture and infection.  The patient understands, agrees and is willing to proceed.    However, given the long-standing nature of her symptoms, I do not think device replacement is going to help. There may be a false negative stress test given her known abnormal catheterization 2005. If her symptoms persist we will have a low threshold for undertaking repeat  catheterization  Euvolemic continue current meds

## 2016-01-23 ENCOUNTER — Ambulatory Visit (INDEPENDENT_AMBULATORY_CARE_PROVIDER_SITE_OTHER): Payer: Medicare Other | Admitting: Internal Medicine

## 2016-01-23 VITALS — BP 140/70 | HR 54 | Ht 60.0 in | Wt 142.8 lb

## 2016-01-23 DIAGNOSIS — Z01812 Encounter for preprocedural laboratory examination: Secondary | ICD-10-CM | POA: Diagnosis not present

## 2016-01-23 DIAGNOSIS — Z950 Presence of cardiac pacemaker: Secondary | ICD-10-CM

## 2016-01-23 DIAGNOSIS — I495 Sick sinus syndrome: Secondary | ICD-10-CM | POA: Diagnosis not present

## 2016-01-23 DIAGNOSIS — I4589 Other specified conduction disorders: Secondary | ICD-10-CM

## 2016-01-23 LAB — CBC WITH DIFFERENTIAL/PLATELET
BASOS ABS: 69 {cells}/uL (ref 0–200)
Basophils Relative: 1 %
EOS PCT: 5 %
Eosinophils Absolute: 345 cells/uL (ref 15–500)
HCT: 34.8 % — ABNORMAL LOW (ref 35.0–45.0)
HEMOGLOBIN: 11.6 g/dL — AB (ref 11.7–15.5)
LYMPHS ABS: 1449 {cells}/uL (ref 850–3900)
LYMPHS PCT: 21 %
MCH: 29.1 pg (ref 27.0–33.0)
MCHC: 33.3 g/dL (ref 32.0–36.0)
MCV: 87.2 fL (ref 80.0–100.0)
MONOS PCT: 8 %
MPV: 9.1 fL (ref 7.5–12.5)
Monocytes Absolute: 552 cells/uL (ref 200–950)
NEUTROS PCT: 65 %
Neutro Abs: 4485 cells/uL (ref 1500–7800)
Platelets: 234 10*3/uL (ref 140–400)
RBC: 3.99 MIL/uL (ref 3.80–5.10)
RDW: 14.9 % (ref 11.0–15.0)
WBC: 6.9 10*3/uL (ref 3.8–10.8)

## 2016-01-23 LAB — BASIC METABOLIC PANEL
BUN: 36 mg/dL — AB (ref 7–25)
CALCIUM: 9.2 mg/dL (ref 8.6–10.4)
CO2: 24 mmol/L (ref 20–31)
CREATININE: 1.29 mg/dL — AB (ref 0.60–0.88)
Chloride: 106 mmol/L (ref 98–110)
GLUCOSE: 85 mg/dL (ref 65–99)
Potassium: 5 mmol/L (ref 3.5–5.3)
Sodium: 139 mmol/L (ref 135–146)

## 2016-01-23 LAB — PROTIME-INR
INR: 1
PROTHROMBIN TIME: 10.3 s (ref 9.0–11.5)

## 2016-01-23 NOTE — Patient Instructions (Signed)
Medication Instructions: Your physician recommends that you continue on your current medications as directed. Please refer to the Current Medication list given to you today.   Labwork: TODAY : BMET, CBC, and PT/INR - pre-procedure labs  Procedures/Testing: Your physician has recommended that you have your battery generator changed  Follow-Up: Your physician recommends that you schedule a follow-up appointment in 10-14 days with the [facility] for a wound check.    Any Additional Special Instructions Will Be Listed Below (If Applicable).     If you need a refill on your cardiac medications before your next appointment, please call your pharmacy.

## 2016-01-27 NOTE — Addendum Note (Signed)
Addended by: Alvis Lemmings C on: 01/27/2016 10:13 AM   Modules accepted: Orders

## 2016-01-28 ENCOUNTER — Encounter (HOSPITAL_COMMUNITY)
Admission: RE | Disposition: A | Discharge status: Home / Self Care | Payer: Self-pay | Source: Ambulatory Visit | Attending: Internal Medicine

## 2016-01-28 ENCOUNTER — Ambulatory Visit (HOSPITAL_COMMUNITY)
Admission: RE | Admit: 2016-01-28 | Discharge: 2016-01-28 | Disposition: A | Discharge status: Home / Self Care | Payer: Medicare Other | Source: Ambulatory Visit | Attending: Internal Medicine | Admitting: Internal Medicine

## 2016-01-28 ENCOUNTER — Encounter (HOSPITAL_COMMUNITY): Payer: Self-pay | Admitting: Internal Medicine

## 2016-01-28 DIAGNOSIS — Z01818 Encounter for other preprocedural examination: Secondary | ICD-10-CM | POA: Insufficient documentation

## 2016-01-28 DIAGNOSIS — Z950 Presence of cardiac pacemaker: Secondary | ICD-10-CM | POA: Diagnosis present

## 2016-01-28 DIAGNOSIS — I495 Sick sinus syndrome: Secondary | ICD-10-CM | POA: Diagnosis not present

## 2016-01-28 DIAGNOSIS — Z45010 Encounter for checking and testing of cardiac pacemaker pulse generator [battery]: Secondary | ICD-10-CM

## 2016-01-28 HISTORY — PX: EP IMPLANTABLE DEVICE: SHX172B

## 2016-01-28 LAB — SURGICAL PCR SCREEN
MRSA, PCR: NEGATIVE
STAPHYLOCOCCUS AUREUS: NEGATIVE

## 2016-01-28 SURGERY — PPM/BIV PPM GENERATOR CHANGEOUT
Anesthesia: LOCAL

## 2016-01-28 MED ORDER — SODIUM CHLORIDE 0.9 % IR SOLN
80.0000 mg | Status: AC
  Administered 2016-01-28: 80 mg
  Filled 2016-01-28: qty 2

## 2016-01-28 MED ORDER — SODIUM CHLORIDE 0.9 % IR SOLN
Status: AC
  Filled 2016-01-28: qty 2

## 2016-01-28 MED ORDER — MUPIROCIN 2 % EX OINT
TOPICAL_OINTMENT | CUTANEOUS | Status: AC
  Filled 2016-01-28: qty 22

## 2016-01-28 MED ORDER — CHLORHEXIDINE GLUCONATE 4 % EX LIQD
60.0000 mL | Freq: Once | CUTANEOUS | Status: DC
  Filled 2016-01-28: qty 60

## 2016-01-28 MED ORDER — SODIUM CHLORIDE 0.9 % IV SOLN
INTRAVENOUS | Status: DC
  Administered 2016-01-28: 10:00:00 via INTRAVENOUS

## 2016-01-28 MED ORDER — SODIUM CHLORIDE 0.9 % IV SOLN: INTRAVENOUS | Status: AC

## 2016-01-28 MED ORDER — FENTANYL CITRATE (PF) 100 MCG/2ML IJ SOLN
INTRAMUSCULAR | Status: AC
  Filled 2016-01-28: qty 2

## 2016-01-28 MED ORDER — LIDOCAINE HCL (PF) 1 % IJ SOLN
INTRAMUSCULAR | Status: DC | PRN
  Administered 2016-01-28: 31 mL via INTRADERMAL

## 2016-01-28 MED ORDER — ONDANSETRON HCL 4 MG/2ML IJ SOLN: 4.0000 mg | Freq: Four times a day (QID) | INTRAMUSCULAR | Status: DC | PRN

## 2016-01-28 MED ORDER — MIDAZOLAM HCL 5 MG/5ML IJ SOLN
INTRAMUSCULAR | Status: AC
  Filled 2016-01-28: qty 5

## 2016-01-28 MED ORDER — MIDAZOLAM HCL 5 MG/5ML IJ SOLN
INTRAMUSCULAR | Status: DC | PRN
  Administered 2016-01-28: 2 mg via INTRAVENOUS
  Administered 2016-01-28 (×2): 1 mg via INTRAVENOUS

## 2016-01-28 MED ORDER — CEFAZOLIN SODIUM-DEXTROSE 2-4 GM/100ML-% IV SOLN
INTRAVENOUS | Status: AC
  Filled 2016-01-28: qty 100

## 2016-01-28 MED ORDER — LIDOCAINE HCL (PF) 1 % IJ SOLN
INTRAMUSCULAR | Status: AC
  Filled 2016-01-28: qty 60

## 2016-01-28 MED ORDER — CEFAZOLIN SODIUM-DEXTROSE 2-4 GM/100ML-% IV SOLN
2.0000 g | INTRAVENOUS | Status: AC
  Administered 2016-01-28: 2 g via INTRAVENOUS
  Filled 2016-01-28: qty 100

## 2016-01-28 MED ORDER — FENTANYL CITRATE (PF) 100 MCG/2ML IJ SOLN
INTRAMUSCULAR | Status: DC | PRN
  Administered 2016-01-28 (×3): 25 ug via INTRAVENOUS

## 2016-01-28 MED ORDER — MUPIROCIN 2 % EX OINT
1.0000 "application " | TOPICAL_OINTMENT | Freq: Once | CUTANEOUS | Status: AC
  Administered 2016-01-28: 1 via TOPICAL
  Filled 2016-01-28: qty 22

## 2016-01-28 MED ORDER — ACETAMINOPHEN 325 MG PO TABS
325.0000 mg | ORAL_TABLET | ORAL | Status: DC | PRN
  Filled 2016-01-28: qty 2

## 2016-01-28 SURGICAL SUPPLY — 5 items
CABLE SURGICAL S-101-97-12 (CABLE) ×1 IMPLANT
PACEMAKER ASSURITY DR-RF (Pacemaker) ×1 IMPLANT
PAD DEFIB LIFELINK (PAD) ×1 IMPLANT
TOOL LEAD REMOVAL 4080 (MISCELLANEOUS) ×1 IMPLANT
TRAY PACEMAKER INSERTION (PACKS) ×1 IMPLANT

## 2016-01-28 NOTE — Discharge Instructions (Signed)
° ° °  Dermabond will come off in 10-14 days, if it doesn't it will removed at follow-up visit. Keep dressing dry until tomorrow evening. No driving for 4 days.   Pacemaker Battery Change, Care After Refer to this sheet in the next few weeks. These instructions provide you with information on caring for yourself after your procedure. Your health care provider may also give you more specific instructions. Your treatment has been planned according to current medical practices, but problems sometimes occur. Call your health care provider if you have any problems or questions after your procedure. WHAT TO EXPECT AFTER THE PROCEDURE After your procedure, it is typical to have the following sensations:  Soreness at the pacemaker site. HOME CARE INSTRUCTIONS   Keep the incision clean and dry.  Unless advised otherwise, you may shower beginning 48 hours after your procedure.  For the first week after the replacement, avoid stretching motions that pull at the incision site, and avoid heavy exercise with the arm that is on the same side as the incision.  Take medicines only as directed by your health care provider.  Keep all follow-up visits as directed by your health care provider. SEEK MEDICAL CARE IF:   You have pain at the incision site that is not relieved by over-the-counter or prescription medicine.  There is drainage or pus from the incision site.  There is swelling larger than a lime at the incision site.  You develop red streaking that extends above or below the incision site.  You feel brief, intermittent palpitations, light-headedness, or any symptoms that you feel might be related to your heart. SEEK IMMEDIATE MEDICAL CARE IF:   You experience chest pain that is different than the pain at the pacemaker site.  You experience shortness of breath.  You have palpitations or irregular heartbeat.  You have light-headedness that does not go away quickly.  You faint.  You have  pain that gets worse and is not relieved by medicine.   This information is not intended to replace advice given to you by your health care provider. Make sure you discuss any questions you have with your health care provider.   Document Released: 03/14/2013 Document Revised: 06/14/2014 Document Reviewed: 03/14/2013 Elsevier Interactive Patient Education [employer].

## 2016-02-01 NOTE — H&P (Signed)
No interval cahng

## 2016-02-06 ENCOUNTER — Encounter: Payer: Self-pay | Admitting: Internal Medicine

## 2016-02-10 ENCOUNTER — Encounter: Payer: Medicare Other | Admitting: Internal Medicine

## 2016-02-12 ENCOUNTER — Ambulatory Visit: Payer: Medicare Other | Admitting: *Deleted

## 2016-02-12 DIAGNOSIS — I495 Sick sinus syndrome: Secondary | ICD-10-CM

## 2016-02-12 LAB — CUP PACEART INCLINIC DEVICE CHECK
Battery Voltage: 3.1 V
Brady Statistic RA Percent Paced: 99.84 %
Implantable Lead Implant Date: 20050901
Implantable Lead Implant Date: 20050901
Implantable Lead Location: 753859
Lead Channel Impedance Value: 512.5 Ohm
Lead Channel Impedance Value: 587.5 Ohm
Lead Channel Pacing Threshold Pulse Width: 0.5 ms
Lead Channel Sensing Intrinsic Amplitude: 12 mV
Lead Channel Setting Pacing Amplitude: 1.125
Lead Channel Setting Pacing Amplitude: 2 V
Lead Channel Setting Sensing Sensitivity: 2 mV
MDC IDC LEAD LOCATION: 753860
MDC IDC MSMT BATTERY REMAINING LONGEVITY: 123.6
MDC IDC MSMT LEADCHNL RA PACING THRESHOLD AMPLITUDE: 0.5 V
MDC IDC MSMT LEADCHNL RV PACING THRESHOLD AMPLITUDE: 0.5 V
MDC IDC MSMT LEADCHNL RV PACING THRESHOLD PULSEWIDTH: 0.5 ms
MDC IDC SESS DTM: 20170907120134
MDC IDC SET LEADCHNL RV PACING PULSEWIDTH: 0.5 ms
MDC IDC STAT BRADY RV PERCENT PACED: 0.12 %
Pulse Gen Serial Number: 7942041

## 2016-02-12 NOTE — Progress Notes (Signed)
Wound check appointment. Steri-strips removed. Wound without redness or edema. Incision edges approximated, wound well healed. Normal device function. Thresholds, sensing, and impedances consistent with implant measurements. Device programmed at chronic values s/p gen change. Pt c/o of intermittent "twinges" on left side, primarily felt in AM. Fixed ventricular outputs at 2.5V. Histogram distribution appropriate for patient and level of activity. No mode switches or high ventricular rates noted. ROV with SK needed. Message sent to scheduling

## 2016-02-18 ENCOUNTER — Telehealth: Payer: Self-pay | Admitting: Internal Medicine

## 2016-02-18 NOTE — Telephone Encounter (Signed)
Abigail Butts is calling because Paula Walls is flaring up and the podiatrists is wanting to know if any of medication could be causing the flare up . Please call   Thanks

## 2016-02-18 NOTE — Telephone Encounter (Signed)
Daughter, Abigail Butts, calling stating Ms. Milson was seen today by podiatrist for flare up of her gout.  States she was given 4 shots of cortisone.  The podiatrist suggested that one of her meds could be causing the flare up.  Advised that Dr. Caryl Comes is not in office this week but will forward to [facility]

## 2016-02-19 NOTE — Telephone Encounter (Signed)
Furosemide would be the only medication the pt is taking that could potentially be contributing to gout flares. Would have Dr. Caryl Comes advise on whether or not pt needs to continue this.

## 2016-02-19 NOTE — Telephone Encounter (Signed)
Forwarded to MD and Pharmacy for review.

## 2016-02-23 NOTE — Telephone Encounter (Signed)
The furosimide is for SOB but gout can be a problem.  Would try and use furosemide every OTHER day

## 2016-02-25 MED ORDER — FUROSEMIDE 20 MG PO TABS: ORAL_TABLET | ORAL | Status: DC

## 2016-02-25 NOTE — Telephone Encounter (Signed)
I called and spoke with the patient regarding lasix possibly contributing to her gout. She states that she stopped this after she was told this could be the cause. She does however feel a little more SOB. I have advised her to try furosemide 20 mg at one tablet every other day per Dr. Caryl Comes and observe her symptoms of gout and SOB. She is agreeable.

## 2016-02-25 NOTE — Telephone Encounter (Signed)
The patient is also aware that her handicap placard application has been signed by Dr. Caryl Comes and I will mail this to her. Address confirmed. She is also aware that Dr. Caryl Comes did not have any luck finding information out on hearing aid programs.  She was grateful for the information.

## 2016-04-28 ENCOUNTER — Encounter: Payer: Self-pay | Admitting: *Deleted

## 2016-05-07 ENCOUNTER — Encounter: Payer: Medicare Other | Admitting: Internal Medicine

## 2016-05-10 ENCOUNTER — Encounter: Payer: Self-pay | Admitting: Internal Medicine

## 2016-06-21 ENCOUNTER — Encounter: Payer: Self-pay | Admitting: *Deleted

## 2016-06-28 ENCOUNTER — Other Ambulatory Visit: Payer: Self-pay | Admitting: Internal Medicine

## 2016-07-02 ENCOUNTER — Other Ambulatory Visit: Payer: Self-pay | Admitting: Internal Medicine

## 2016-07-05 ENCOUNTER — Encounter: Payer: Self-pay | Admitting: Internal Medicine

## 2016-07-05 ENCOUNTER — Ambulatory Visit (INDEPENDENT_AMBULATORY_CARE_PROVIDER_SITE_OTHER): Payer: Medicare Other | Admitting: Internal Medicine

## 2016-07-05 VITALS — BP 120/70 | HR 65 | Ht 60.0 in | Wt 140.4 lb

## 2016-07-05 DIAGNOSIS — I4589 Other specified conduction disorders: Secondary | ICD-10-CM | POA: Diagnosis not present

## 2016-07-05 DIAGNOSIS — Z950 Presence of cardiac pacemaker: Secondary | ICD-10-CM

## 2016-07-05 DIAGNOSIS — I495 Sick sinus syndrome: Secondary | ICD-10-CM

## 2016-07-05 LAB — CUP PACEART INCLINIC DEVICE CHECK
Battery Voltage: 3.01 V
Brady Statistic RA Percent Paced: 99 %
Brady Statistic RV Percent Paced: 0.16 %
Implantable Lead Implant Date: 20050901
Implantable Lead Location: 753859
Implantable Pulse Generator Implant Date: 20170823
Lead Channel Impedance Value: 512.5 Ohm
Lead Channel Pacing Threshold Amplitude: 0.5 V
Lead Channel Pacing Threshold Amplitude: 0.5 V
Lead Channel Pacing Threshold Amplitude: 0.75 V
Lead Channel Pacing Threshold Pulse Width: 0.5 ms
Lead Channel Pacing Threshold Pulse Width: 0.5 ms
Lead Channel Pacing Threshold Pulse Width: 0.5 ms
Lead Channel Sensing Intrinsic Amplitude: 1.6 mV
Lead Channel Sensing Intrinsic Amplitude: 12 mV
Lead Channel Setting Pacing Amplitude: 2 V
Lead Channel Setting Pacing Amplitude: 2.5 V
MDC IDC LEAD IMPLANT DT: 20050901
MDC IDC LEAD LOCATION: 753860
MDC IDC MSMT LEADCHNL RA PACING THRESHOLD PULSEWIDTH: 0.5 ms
MDC IDC MSMT LEADCHNL RV IMPEDANCE VALUE: 587.5 Ohm
MDC IDC MSMT LEADCHNL RV PACING THRESHOLD AMPLITUDE: 0.75 V
MDC IDC PG SERIAL: 7942041
MDC IDC SESS DTM: 20180129155151
MDC IDC SET LEADCHNL RV PACING PULSEWIDTH: 0.5 ms
MDC IDC SET LEADCHNL RV SENSING SENSITIVITY: 2 mV

## 2016-07-05 MED ORDER — METOPROLOL SUCCINATE ER 50 MG PO TB24: ORAL_TABLET | ORAL | Status: DC

## 2016-07-05 MED ORDER — VALSARTAN 320 MG PO TABS: 160.0000 mg | ORAL_TABLET | Freq: Every day | ORAL | 3 refills | Status: DC

## 2016-07-05 MED ORDER — ISOSORBIDE MONONITRATE ER 60 MG PO TB24: 60.0000 mg | ORAL_TABLET | Freq: Every day | ORAL | 3 refills | Status: DC

## 2016-07-05 NOTE — Patient Instructions (Addendum)
Medication Instructions: - Your physician has recommended you make the following change in your medication:  1) Decrease metoprolol succinate (toprol) to 25 mg - Take 0.5 tablet by mouth day with or immediately following a meal 2) Decrease valsartan (diovan)  To  160 mg - Take 0.5 tablet by mouth daily.  3) Start imdur (isosorbide) 60 mg -Take 1 tablet by mouth daily **NEW RX sent to Pharmacy**   Labwork: - Your physician recommends that you have lab work today: BMP  Procedures/Testing: - none ordered  Follow-Up: - Remote monitoring is used to monitor your Pacemaker from home. This monitoring reduces the number of office visits required to check your device to one time per year. It allows [place] to keep an eye on the functioning of your device to ensure it is working properly. You are scheduled for a device check from home on 10/04/16. You may send your transmission at any time that day. If you have a wireless device, the transmission will be sent automatically. After your physician reviews your transmission, you will receive a postcard with your next transmission date.  - Your physician wants you to follow-up in: 9 months with Dr. Caryl Comes. You will receive a reminder letter in the mail two months in advance. If you don't receive a letter, please call our office to schedule the follow-up appointment.   Any Additional Special Instructions Will Be Listed Below (If Applicable).     If you need a refill on your cardiac medications before your next appointment, please call your pharmacy.

## 2016-07-05 NOTE — Progress Notes (Signed)
Patient Care Team: Dione Housekeeper, MD as PCP - General (Family Medicine)   HPI  Paula Walls is a 83 y.o. female is seen following pacemaker implantation for sinus node dysfunction. She underwent generator replacement 2017  She has no intrinsic atrial rhythm.  She has normal left ventricular function; by echo in 2008 and catheterization 2009 demonstrated moderate diffuse disease also with normal ejection fraction.  Catheterization 2005 described high grade LAD diagonal bifurcation disease The patient complains of DOE and esertional chest pressure which are both relieved by rest.    Both complaints are similar to now she underwent Myoview scanning 4/17 demonstrated normal left ventricular function and no significant ischemia  No lightheadedness           Past Medical History:  Diagnosis Date  . Chronotropic incompetence   . Coronary artery disease   . Diverticulosis   . Hyperlipidemia   . Hypertension   . Hypothyroidism   . Iron deficiency anemia   . MRSA (methicillin resistant Staphylococcus aureus)   . Sinoatrial node dysfunction (HCC)   . UC (ulcerative colitis) [facility])     Past Surgical History:  Procedure Laterality Date  . CARDIAC CATHETERIZATION  02/04/2004  . EP IMPLANTABLE DEVICE N/A 01/28/2016   Procedure: PPM Generator Changeout;  Surgeon: Deboraha Sprang, MD;  Location: [facility];  Service: Cardiovascular;  Laterality: N/A;  . PACEMAKER INSERTION  02/06/2004   St. Jude  . THYROIDECTOMY, PARTIAL      Current Outpatient Prescriptions  Medication Sig Dispense Refill  . amLODipine (NORVASC) 2.5 MG tablet Take 2.5 mg by mouth at bedtime.    . furosemide (LASIX) 20 MG tablet Take one tablet (20 mg ) by mouth every other day    . levothyroxine (SYNTHROID, LEVOTHROID) 100 MCG tablet Take 100 mcg by mouth daily.    . metoprolol succinate (TOPROL-XL) 50 MG 24 hr tablet Take 50 mg by mouth daily. Take with or immediately following a meal.    . sodium  chloride (OCEAN) 0.65 % SOLN nasal spray Place 2 sprays into both nostrils as needed for congestion.    . valsartan (DIOVAN) 320 MG tablet Take 1 tablet (320 mg total) by mouth daily. 90 tablet 3   No current facility-administered medications for this visit.     Allergies  Allergen Reactions  . Flagyl [Metronidazole Hcl] Other (See Comments)    Extreme itch in head. Resembles lice  . Metronidazole Itching and Other (See Comments)    In head    Review of Systems negative except from HPI and PMH  Physical Exam BP 120/70   Pulse 65   Ht 5' (1.524 m)   Wt 140 lb 6.4 oz (63.7 kg)   SpO2 98%   BMI 27.42 kg/m  Well developed and well nourished in no acute distress HENT normal E scleral and icterus clear Neck Supple JVP flat; carotids brisk and full Clear to ausculation  Device pocket well healed; without hematoma or erythema.  There is no tethering *Regular rate and rhythm, no murmurs gallops or rub Soft with active bowel sounds No clubbing cyanosis none Edema Alert and oriented, grossly normal motor and sensory function; reflexes are intact in the upper extremities bilaterally Decreases shoulder mobility Skin Warm and Dry   ECG demonstrates atrial pacing at 60 intervals 21/08/43  Assessment and  Plan  Sinus node dysfunction/ chronotropic incompetence  The patient's device was interrogated.  The information was reviewed. No changes were made  in the programming.  (See below) She was walked in in the office and was able to generate heart rates in the high 80s   Pacemaker-St. Jude  The patient's device was interrogated.  The information was reviewed. No changes were made in the programming.    Hypertension  Poorly controlled  Chest pain> DOE  coronary disease diffuse  HFpEF   Renal Insufficiency  Class 3 Cr 1.29  With GFR  40 or so     She is euvolemic. She takes her diuretics as needed and will continue that.  I suspect that she is having angina/ischemia given her  known coronary disease. Notwithstanding her negative Myoview, we will undertaken. Trial of nitrates. So as not to encroach upon low blood pressure we will decrease her metoprolol from 50--25. In this regard is notable as she is chronotropic incompetence anyway and she may benefit from augmented rate response although for right now she's been having chest pain with high rates. In addition, we will decrease her losartan from 320--160.

## 2016-07-06 LAB — BASIC METABOLIC PANEL
BUN / CREAT RATIO: 25 (ref 12–28)
BUN: 28 mg/dL — AB (ref 8–27)
CO2: 24 mmol/L (ref 18–29)
Calcium: 9.5 mg/dL (ref 8.7–10.3)
Chloride: 101 mmol/L (ref 96–106)
Creatinine, Ser: 1.12 mg/dL — ABNORMAL HIGH (ref 0.57–1.00)
GFR calc non Af Amer: 46 mL/min/{1.73_m2} — ABNORMAL LOW (ref >59)
GFR, EST AFRICAN AMERICAN: 53 mL/min/{1.73_m2} — AB (ref >59)
Glucose: 86 mg/dL (ref 65–99)
Potassium: 4.8 mmol/L (ref 3.5–5.2)
Sodium: 141 mmol/L (ref 134–144)

## 2016-07-13 ENCOUNTER — Encounter: Payer: Self-pay | Admitting: Internal Medicine

## 2016-07-13 ENCOUNTER — Other Ambulatory Visit: Payer: Self-pay | Admitting: Internal Medicine

## 2016-10-04 ENCOUNTER — Ambulatory Visit (INDEPENDENT_AMBULATORY_CARE_PROVIDER_SITE_OTHER): Payer: Medicare Other | Admitting: *Deleted

## 2016-10-04 DIAGNOSIS — I495 Sick sinus syndrome: Secondary | ICD-10-CM | POA: Diagnosis not present

## 2016-10-04 NOTE — Progress Notes (Signed)
Remote pacemaker transmission.   

## 2016-10-05 ENCOUNTER — Encounter: Payer: Self-pay | Admitting: Cardiology

## 2016-10-05 LAB — CUP PACEART REMOTE DEVICE CHECK
Battery Remaining Longevity: 110 mo
Battery Remaining Percentage: 95.5 %
Brady Statistic RA Percent Paced: 99 %
Brady Statistic RV Percent Paced: 1 %
Implantable Lead Implant Date: 20050901
Lead Channel Impedance Value: 490 Ohm
Lead Channel Pacing Threshold Amplitude: 0.75 V
Lead Channel Sensing Intrinsic Amplitude: 1.5 mV
Lead Channel Sensing Intrinsic Amplitude: 12 mV
Lead Channel Setting Pacing Amplitude: 2 V
Lead Channel Setting Pacing Amplitude: 2.5 V
Lead Channel Setting Pacing Pulse Width: 0.5 ms
Lead Channel Setting Sensing Sensitivity: 2 mV
MDC IDC LEAD IMPLANT DT: 20050901
MDC IDC LEAD LOCATION: 753859
MDC IDC LEAD LOCATION: 753860
MDC IDC MSMT BATTERY VOLTAGE: 3.01 V
MDC IDC MSMT LEADCHNL RA PACING THRESHOLD AMPLITUDE: 0.5 V
MDC IDC MSMT LEADCHNL RA PACING THRESHOLD PULSEWIDTH: 0.5 ms
MDC IDC MSMT LEADCHNL RV IMPEDANCE VALUE: 510 Ohm
MDC IDC MSMT LEADCHNL RV PACING THRESHOLD PULSEWIDTH: 0.5 ms
MDC IDC PG IMPLANT DT: 20170823
MDC IDC PG SERIAL: 7942041
MDC IDC SESS DTM: 20180430142617
MDC IDC STAT BRADY AP VP PERCENT: 1 %
MDC IDC STAT BRADY AP VS PERCENT: 99 %
MDC IDC STAT BRADY AS VP PERCENT: 1 %
MDC IDC STAT BRADY AS VS PERCENT: 1 %

## 2016-11-05 ENCOUNTER — Other Ambulatory Visit: Payer: Self-pay | Admitting: Internal Medicine

## 2017-01-03 ENCOUNTER — Ambulatory Visit (INDEPENDENT_AMBULATORY_CARE_PROVIDER_SITE_OTHER): Payer: Medicare Other | Admitting: *Deleted

## 2017-01-03 ENCOUNTER — Telehealth: Payer: Self-pay | Admitting: Cardiology

## 2017-01-03 DIAGNOSIS — I495 Sick sinus syndrome: Secondary | ICD-10-CM | POA: Diagnosis not present

## 2017-01-03 NOTE — Telephone Encounter (Signed)
Attempted to confirm remote transmission with pt. No answer and was unable to leave a message.   

## 2017-01-04 NOTE — Progress Notes (Signed)
Remote pacemaker transmission.   

## 2017-01-07 ENCOUNTER — Encounter: Payer: Self-pay | Admitting: Cardiology

## 2017-01-25 ENCOUNTER — Telehealth: Payer: Self-pay | Admitting: Pharmacist

## 2017-01-25 MED ORDER — IRBESARTAN 150 MG PO TABS: 150.0000 mg | ORAL_TABLET | Freq: Every day | ORAL | 1 refills | Status: DC

## 2017-01-25 NOTE — Telephone Encounter (Signed)
Pt called regarding valsartan recall. All questions answered. Will change to irbesartan 127m daily. Advised pt to check BP and call with any changes she states understanding and appreciation.

## 2017-01-31 ENCOUNTER — Telehealth: Payer: Self-pay | Admitting: Internal Medicine

## 2017-01-31 NOTE — Telephone Encounter (Signed)
Discussed with patient, pt will continue irbesartan

## 2017-01-31 NOTE — Telephone Encounter (Signed)
Irbesartan should not cause toe swelling and is associated with gout about the same as valsartan (<1%) (it should not increase her risk of gout flare anymore than valsartan would). Irbesartan does not interact with any of her medications (same as valsartan). I would doubt this is from the medication change.

## 2017-01-31 NOTE — Telephone Encounter (Signed)
Pt states she started irbesartan 150 mg daily 01/26/17 in place of valsartan. Pt states she woke up yesterday morning with swollen right big toe and top of right foot, sore and painful to touch. Pt states she has had gout in the past and this is similar to that.  Pt is asking if this is a reaction to medication she is taking or if it may be an interaction between medication she is taking. Pt advised I will forward to pharmacist for review.

## 2017-01-31 NOTE — Telephone Encounter (Signed)
New message     Pt c/o medication issue:  1. Name of Medication:   isosorbide mononitrate (IMDUR) 60 MG 24 hr tablet Take 1 tablet (60 mg total) by mouth daily     2. How are you currently taking this medication (dosage and times per day)? 1 tablet a day  3. Are you having a reaction (difficulty breathing--STAT)? yes  4. What is your medication issue?  It has her toe swollen and very painful

## 2017-02-04 LAB — CUP PACEART REMOTE DEVICE CHECK
Battery Remaining Percentage: 95.5 %
Battery Voltage: 3.01 V
Brady Statistic AP VP Percent: 1 %
Brady Statistic AS VS Percent: 1 %
Brady Statistic RA Percent Paced: 97 %
Brady Statistic RV Percent Paced: 1 %
Date Time Interrogation Session: 20180730202837
Implantable Lead Implant Date: 20050901
Implantable Lead Location: 753859
Implantable Lead Location: 753860
Implantable Pulse Generator Implant Date: 20170823
Lead Channel Impedance Value: 530 Ohm
Lead Channel Pacing Threshold Amplitude: 0.5 V
Lead Channel Pacing Threshold Pulse Width: 0.5 ms
Lead Channel Pacing Threshold Pulse Width: 0.5 ms
Lead Channel Sensing Intrinsic Amplitude: 1.5 mV
Lead Channel Setting Pacing Amplitude: 2.5 V
Lead Channel Setting Sensing Sensitivity: 2 mV
MDC IDC LEAD IMPLANT DT: 20050901
MDC IDC MSMT BATTERY REMAINING LONGEVITY: 108 mo
MDC IDC MSMT LEADCHNL RA IMPEDANCE VALUE: 480 Ohm
MDC IDC MSMT LEADCHNL RV PACING THRESHOLD AMPLITUDE: 0.75 V
MDC IDC MSMT LEADCHNL RV SENSING INTR AMPL: 12 mV
MDC IDC PG SERIAL: 7942041
MDC IDC SET LEADCHNL RA PACING AMPLITUDE: 2 V
MDC IDC SET LEADCHNL RV PACING PULSEWIDTH: 0.5 ms
MDC IDC STAT BRADY AP VS PERCENT: 99 %
MDC IDC STAT BRADY AS VP PERCENT: 1 %

## 2017-04-04 ENCOUNTER — Ambulatory Visit (INDEPENDENT_AMBULATORY_CARE_PROVIDER_SITE_OTHER): Payer: Medicare Other | Admitting: *Deleted

## 2017-04-04 DIAGNOSIS — I495 Sick sinus syndrome: Secondary | ICD-10-CM | POA: Diagnosis not present

## 2017-04-05 NOTE — Progress Notes (Signed)
Remote pacemaker transmission.   

## 2017-04-08 LAB — CUP PACEART REMOTE DEVICE CHECK
Battery Voltage: 3.01 V
Brady Statistic AP VP Percent: 1 %
Brady Statistic RA Percent Paced: 98 %
Date Time Interrogation Session: 20181029060014
Implantable Lead Implant Date: 20050901
Implantable Lead Location: 753859
Implantable Lead Location: 753860
Implantable Pulse Generator Implant Date: 20170823
Lead Channel Impedance Value: 510 Ohm
Lead Channel Pacing Threshold Amplitude: 0.5 V
Lead Channel Pacing Threshold Pulse Width: 0.5 ms
Lead Channel Setting Sensing Sensitivity: 2 mV
MDC IDC LEAD IMPLANT DT: 20050901
MDC IDC MSMT BATTERY REMAINING LONGEVITY: 107 mo
MDC IDC MSMT BATTERY REMAINING PERCENTAGE: 95.5 %
MDC IDC MSMT LEADCHNL RA IMPEDANCE VALUE: 490 Ohm
MDC IDC MSMT LEADCHNL RA SENSING INTR AMPL: 1.5 mV
MDC IDC MSMT LEADCHNL RV PACING THRESHOLD AMPLITUDE: 0.75 V
MDC IDC MSMT LEADCHNL RV PACING THRESHOLD PULSEWIDTH: 0.5 ms
MDC IDC MSMT LEADCHNL RV SENSING INTR AMPL: 12 mV
MDC IDC SET LEADCHNL RA PACING AMPLITUDE: 2 V
MDC IDC SET LEADCHNL RV PACING AMPLITUDE: 2.5 V
MDC IDC SET LEADCHNL RV PACING PULSEWIDTH: 0.5 ms
MDC IDC STAT BRADY AP VS PERCENT: 99 %
MDC IDC STAT BRADY AS VP PERCENT: 1 %
MDC IDC STAT BRADY AS VS PERCENT: 1 %
MDC IDC STAT BRADY RV PERCENT PACED: 1 %
Pulse Gen Model: 2272
Pulse Gen Serial Number: 7942041

## 2017-04-12 ENCOUNTER — Encounter: Payer: Self-pay | Admitting: Cardiology

## 2017-04-19 ENCOUNTER — Encounter: Payer: Self-pay | Admitting: Internal Medicine

## 2017-04-19 ENCOUNTER — Ambulatory Visit (INDEPENDENT_AMBULATORY_CARE_PROVIDER_SITE_OTHER): Payer: Medicare Other | Admitting: Internal Medicine

## 2017-04-19 VITALS — BP 146/84 | HR 60 | Resp 16 | Ht 60.0 in | Wt 141.8 lb

## 2017-04-19 DIAGNOSIS — I495 Sick sinus syndrome: Secondary | ICD-10-CM

## 2017-04-19 DIAGNOSIS — Z950 Presence of cardiac pacemaker: Secondary | ICD-10-CM

## 2017-04-19 DIAGNOSIS — I4589 Other specified conduction disorders: Secondary | ICD-10-CM | POA: Diagnosis not present

## 2017-04-19 LAB — CUP PACEART INCLINIC DEVICE CHECK
Battery Voltage: 3.01 V
Brady Statistic RA Percent Paced: 98 %
Date Time Interrogation Session: 20181113170236
Implantable Lead Implant Date: 20050901
Implantable Lead Location: 753860
Implantable Pulse Generator Implant Date: 20170823
Lead Channel Impedance Value: 612.5 Ohm
Lead Channel Pacing Threshold Pulse Width: 0.5 ms
Lead Channel Pacing Threshold Pulse Width: 0.5 ms
Lead Channel Sensing Intrinsic Amplitude: 12 mV
Lead Channel Setting Pacing Pulse Width: 0.5 ms
Lead Channel Setting Sensing Sensitivity: 2 mV
MDC IDC LEAD IMPLANT DT: 20050901
MDC IDC LEAD LOCATION: 753859
MDC IDC MSMT BATTERY REMAINING LONGEVITY: 122 mo
MDC IDC MSMT LEADCHNL RA IMPEDANCE VALUE: 512.5 Ohm
MDC IDC MSMT LEADCHNL RA PACING THRESHOLD AMPLITUDE: 0.5 V
MDC IDC MSMT LEADCHNL RA PACING THRESHOLD AMPLITUDE: 0.5 V
MDC IDC MSMT LEADCHNL RA PACING THRESHOLD PULSEWIDTH: 0.5 ms
MDC IDC MSMT LEADCHNL RA SENSING INTR AMPL: 1.4 mV
MDC IDC MSMT LEADCHNL RV PACING THRESHOLD AMPLITUDE: 0.75 V
MDC IDC MSMT LEADCHNL RV PACING THRESHOLD AMPLITUDE: 0.75 V
MDC IDC MSMT LEADCHNL RV PACING THRESHOLD PULSEWIDTH: 0.5 ms
MDC IDC PG SERIAL: 7942041
MDC IDC SET LEADCHNL RA PACING AMPLITUDE: 2 V
MDC IDC SET LEADCHNL RV PACING AMPLITUDE: 2.5 V
MDC IDC STAT BRADY RV PERCENT PACED: 0.19 %
Pulse Gen Model: 2272

## 2017-04-19 MED ORDER — FUROSEMIDE 20 MG PO TABS: 20.0000 mg | ORAL_TABLET | ORAL | 3 refills | Status: DC

## 2017-04-19 MED ORDER — IRBESARTAN 150 MG PO TABS: 150.0000 mg | ORAL_TABLET | Freq: Every day | ORAL | 3 refills | Status: DC

## 2017-04-19 MED ORDER — AMLODIPINE BESYLATE 2.5 MG PO TABS: 2.5000 mg | ORAL_TABLET | Freq: Every day | ORAL | 3 refills | Status: DC

## 2017-04-19 MED ORDER — METOPROLOL SUCCINATE ER 50 MG PO TB24: ORAL_TABLET | ORAL | 3 refills | Status: DC

## 2017-04-19 MED ORDER — ISOSORBIDE MONONITRATE ER 60 MG PO TB24: 60.0000 mg | ORAL_TABLET | Freq: Every day | ORAL | 3 refills | Status: DC

## 2017-04-19 MED ORDER — LEVOTHYROXINE SODIUM 100 MCG PO TABS: 100.0000 ug | ORAL_TABLET | Freq: Every day | ORAL | 3 refills | Status: AC

## 2017-04-19 NOTE — Progress Notes (Signed)
Patient Care Team: Dione Housekeeper, MD as PCP - General (Family Medicine)   HPI  Paula Walls is a 83 y.o. female is seen following pacemaker implantation for sinus node dysfunction. She underwent generator replacement 2017  She has no intrinsic atrial rhythm.  She has normal left ventricular function; by echo in 2008 and catheterization 2009 demonstrated moderate diffuse disease also with normal ejection fraction.  Catheterization 2005 described high grade LAD diagonal bifurcation disease    Both complaints are similar to now she underwent Myoview scanning 4/17 demonstrated normal left ventricular function and no significant ischemia   DATE TEST     4*/17 Myoview   EF 65 % No ischemia  Hyperdynamic function               [place].  She got somewhat short of breath but this was far in excess of her usual activity.  She had a  pumpkin whoopie cookie       Past Medical History:  Diagnosis Date  . Chronotropic incompetence   . Coronary artery disease   . Diverticulosis   . Hyperlipidemia   . Hypertension   . Hypothyroidism   . Iron deficiency anemia   . MRSA (methicillin resistant Staphylococcus aureus)   . Sinoatrial node dysfunction (HCC)   . UC (ulcerative colitis) [facility])     Past Surgical History:  Procedure Laterality Date  . CARDIAC CATHETERIZATION  02/04/2004  . PACEMAKER INSERTION  02/06/2004   St. Jude  . THYROIDECTOMY, PARTIAL      Current Outpatient Medications  Medication Sig Dispense Refill  . amLODipine (NORVASC) 2.5 MG tablet TAKE 1 TABLET BY MOUTH EVERY DAY 90 tablet 1  . furosemide (LASIX) 20 MG tablet Take one tablet (20 mg ) by mouth every other day    . irbesartan (AVAPRO) 150 MG tablet Take 1 tablet (150 mg total) by mouth daily. 90 tablet 1  . isosorbide mononitrate (IMDUR) 60 MG 24 hr tablet Take 1 tablet (60 mg total) by mouth daily. 90 tablet 3  . levothyroxine (SYNTHROID, LEVOTHROID) 100 MCG tablet  Take 100 mcg by mouth daily.    . metoprolol succinate (TOPROL-XL) 50 MG 24 hr tablet Take 0.5 tablet by mouth daily with or immediately following a meal.    . sodium chloride (OCEAN) 0.65 % SOLN nasal spray Place 2 sprays into both nostrils as needed for congestion.     No current facility-administered medications for this visit.     Allergies  Allergen Reactions  . Flagyl [Metronidazole Hcl] Other (See Comments)    Extreme itch in head. Resembles lice    Review of Systems negative except from HPI and PMH  Physical Exam BP (!) 146/84   Pulse 60   Resp 16   Ht 5' (1.524 m)   Wt 141 lb 12.8 oz (64.3 kg)   SpO2 96%   BMI 27.69 kg/m  Well developed and nourished in no acute distress HENT normal Neck supple with JVP-flat Clear Device pocket well healed; without hematoma or erythema.  There is no tethering  Regular rate and rhythm, no murmurs or gallops Abd-soft with active BS No Clubbing cyanosis edema Skin-warm and dry A & Oriented  Grossly normal sensory and motor function    ECG demonstrates atrial pacing 60 21/08/43  Assessment and  Plan  Sinus node dysfunction/ chronotropic incompetence    Pacemaker-St. Jude  The patient's device was interrogated.  The  information was reviewed. No changes were made in the programming.    Hypertension    Dypsnea on Exertion   Coronary disease diffuse but non obstructive   HFpEF   Renal Insufficiency  Class 3 Cr 1.29  With GFR  40 or so     Euvolemic continue current meds  BP reasonably controlled  Complicated regime  Without symptoms of ischemia  Her dyspnea occurred in the context of a major excess and normal work.  There were no accompanying symptoms concerning for ischemia.  She is encouraged to continue to exercise.

## 2017-04-19 NOTE — Patient Instructions (Addendum)
Medication Instructions: Your physician recommends that you continue on your current medications as directed. Please refer to the Current Medication list given to you today.  Labwork: None Ordered  Procedures/Testing: None Ordered  Follow-Up: Your physician wants you to follow-up in: 1 YEAR with Dr. Caryl Comes. You will receive a reminder letter in the mail two months in advance. If you don't receive a letter, please call our office to schedule the follow-up appointment.  Remote monitoring is used to monitor your Pacemaker from home. This monitoring reduces the number of office visits required to check your device to one time per year. It allows [place] to keep an eye on the functioning of your device to ensure it is working properly. You are scheduled for a device check from home on 07/04/17. You may send your transmission at any time that day. If you have a wireless device, the transmission will be sent automatically. After your physician reviews your transmission, you will receive a postcard with your next transmission date.  If you need a refill on your cardiac medications before your next appointment, please call your pharmacy.

## 2017-07-04 ENCOUNTER — Ambulatory Visit (INDEPENDENT_AMBULATORY_CARE_PROVIDER_SITE_OTHER): Payer: Medicare Other | Admitting: *Deleted

## 2017-07-04 DIAGNOSIS — I495 Sick sinus syndrome: Secondary | ICD-10-CM | POA: Diagnosis not present

## 2017-07-04 NOTE — Progress Notes (Signed)
Remote pacemaker transmission.   

## 2017-07-06 ENCOUNTER — Encounter: Payer: Self-pay | Admitting: Cardiology

## 2017-07-07 LAB — CUP PACEART REMOTE DEVICE CHECK
Battery Remaining Longevity: 116 mo
Battery Remaining Percentage: 95.5 %
Brady Statistic AP VP Percent: 1 %
Brady Statistic RA Percent Paced: 99 %
Brady Statistic RV Percent Paced: 1 %
Implantable Lead Implant Date: 20050901
Implantable Lead Location: 753860
Implantable Pulse Generator Implant Date: 20170823
Lead Channel Impedance Value: 510 Ohm
Lead Channel Impedance Value: 540 Ohm
Lead Channel Pacing Threshold Amplitude: 0.75 V
Lead Channel Pacing Threshold Pulse Width: 0.5 ms
Lead Channel Sensing Intrinsic Amplitude: 1.4 mV
Lead Channel Setting Pacing Amplitude: 2 V
Lead Channel Setting Pacing Pulse Width: 0.5 ms
Lead Channel Setting Sensing Sensitivity: 2 mV
MDC IDC LEAD IMPLANT DT: 20050901
MDC IDC LEAD LOCATION: 753859
MDC IDC MSMT BATTERY VOLTAGE: 3.01 V
MDC IDC MSMT LEADCHNL RA PACING THRESHOLD AMPLITUDE: 0.5 V
MDC IDC MSMT LEADCHNL RV PACING THRESHOLD PULSEWIDTH: 0.5 ms
MDC IDC MSMT LEADCHNL RV SENSING INTR AMPL: 12 mV
MDC IDC PG SERIAL: 7942041
MDC IDC SESS DTM: 20190128070029
MDC IDC SET LEADCHNL RV PACING AMPLITUDE: 2.5 V
MDC IDC STAT BRADY AP VS PERCENT: 99 %
MDC IDC STAT BRADY AS VP PERCENT: 1 %
MDC IDC STAT BRADY AS VS PERCENT: 1 %

## 2017-07-17 ENCOUNTER — Telehealth: Payer: Self-pay | Admitting: Physician Assistant

## 2017-07-17 NOTE — Telephone Encounter (Signed)
Patient's daughter called cardiology office due to concern of high blood pressure. She woke up a little dizzy today, no chest pain or SOB. H/o PPM. BP checked around 12PM by daughter 184/82 HR 64, recheck BP 196/91, HR 62. Daughter gave the patient her 4 BP medications around 12 as well.   Since BP medications were just given around 30 min ago, I instructed the daughter to recheck BP around 2 PM, if SBP is > 160, she can take additional 2.31m amlodipine. She will contact [place] her SBP remain higher after additional amlodipine.   SHilbert CorriganPA Pager: [phone]

## 2017-07-18 ENCOUNTER — Emergency Department (HOSPITAL_COMMUNITY): Payer: Medicare Other

## 2017-07-18 ENCOUNTER — Encounter (HOSPITAL_COMMUNITY): Payer: Self-pay

## 2017-07-18 ENCOUNTER — Telehealth: Payer: Self-pay | Admitting: Internal Medicine

## 2017-07-18 ENCOUNTER — Emergency Department (HOSPITAL_COMMUNITY)
Admission: EM | Admit: 2017-07-18 | Discharge: 2017-07-18 | Disposition: A | Discharge status: Home / Self Care | Payer: Medicare Other | Attending: Emergency Medicine | Admitting: Emergency Medicine

## 2017-07-18 DIAGNOSIS — E039 Hypothyroidism, unspecified: Secondary | ICD-10-CM | POA: Diagnosis not present

## 2017-07-18 DIAGNOSIS — I10 Essential (primary) hypertension: Secondary | ICD-10-CM

## 2017-07-18 DIAGNOSIS — Z950 Presence of cardiac pacemaker: Secondary | ICD-10-CM | POA: Diagnosis not present

## 2017-07-18 DIAGNOSIS — Z79899 Other long term (current) drug therapy: Secondary | ICD-10-CM | POA: Diagnosis not present

## 2017-07-18 DIAGNOSIS — R531 Weakness: Secondary | ICD-10-CM

## 2017-07-18 DIAGNOSIS — R42 Dizziness and giddiness: Secondary | ICD-10-CM | POA: Diagnosis present

## 2017-07-18 DIAGNOSIS — I2510 Atherosclerotic heart disease of native coronary artery without angina pectoris: Secondary | ICD-10-CM | POA: Insufficient documentation

## 2017-07-18 DIAGNOSIS — K5190 Ulcerative colitis, unspecified, without complications: Secondary | ICD-10-CM | POA: Insufficient documentation

## 2017-07-18 LAB — URINALYSIS, ROUTINE W REFLEX MICROSCOPIC
Bilirubin Urine: NEGATIVE
GLUCOSE, UA: NEGATIVE mg/dL
HGB URINE DIPSTICK: NEGATIVE
KETONES UR: NEGATIVE mg/dL
Nitrite: NEGATIVE
PROTEIN: NEGATIVE mg/dL
Specific Gravity, Urine: 1.01 (ref 1.005–1.030)
Squamous Epithelial / LPF: NONE SEEN
pH: 5 (ref 5.0–8.0)

## 2017-07-18 LAB — APTT: APTT: 30 s (ref 24–36)

## 2017-07-18 LAB — COMPREHENSIVE METABOLIC PANEL
ALBUMIN: 3.5 g/dL (ref 3.5–5.0)
ALT: 32 U/L (ref 14–54)
ANION GAP: 11 (ref 5–15)
AST: 33 U/L (ref 15–41)
Alkaline Phosphatase: 77 U/L (ref 38–126)
BILIRUBIN TOTAL: 0.4 mg/dL (ref 0.3–1.2)
BUN: 22 mg/dL — AB (ref 6–20)
CO2: 22 mmol/L (ref 22–32)
Calcium: 9.4 mg/dL (ref 8.9–10.3)
Chloride: 103 mmol/L (ref 101–111)
Creatinine, Ser: 1.35 mg/dL — ABNORMAL HIGH (ref 0.44–1.00)
GFR calc Af Amer: 41 mL/min — ABNORMAL LOW (ref >60)
GFR calc non Af Amer: 35 mL/min — ABNORMAL LOW (ref >60)
GLUCOSE: 113 mg/dL — AB (ref 65–99)
Potassium: 4.7 mmol/L (ref 3.5–5.1)
SODIUM: 136 mmol/L (ref 135–145)
TOTAL PROTEIN: 6.8 g/dL (ref 6.5–8.1)

## 2017-07-18 LAB — CBC
HCT: 37.1 % (ref 36.0–46.0)
HEMOGLOBIN: 12.3 g/dL (ref 12.0–15.0)
MCH: 29.8 pg (ref 26.0–34.0)
MCHC: 33.2 g/dL (ref 30.0–36.0)
MCV: 89.8 fL (ref 78.0–100.0)
PLATELETS: 318 10*3/uL (ref 150–400)
RBC: 4.13 MIL/uL (ref 3.87–5.11)
RDW: 14 % (ref 11.5–15.5)
WBC: 9.8 10*3/uL (ref 4.0–10.5)

## 2017-07-18 LAB — DIFFERENTIAL
BASOS ABS: 0 10*3/uL (ref 0.0–0.1)
Basophils Relative: 0 %
Eosinophils Absolute: 0.2 10*3/uL (ref 0.0–0.7)
Eosinophils Relative: 2 %
LYMPHS ABS: 1.3 10*3/uL (ref 0.7–4.0)
LYMPHS PCT: 14 %
Monocytes Absolute: 0.3 10*3/uL (ref 0.1–1.0)
Monocytes Relative: 3 %
NEUTROS PCT: 81 %
Neutro Abs: 7.9 10*3/uL — ABNORMAL HIGH (ref 1.7–7.7)

## 2017-07-18 LAB — I-STAT TROPONIN, ED: Troponin i, poc: 0 ng/mL (ref 0.00–0.08)

## 2017-07-18 LAB — PROTIME-INR
INR: 0.96
Prothrombin Time: 12.7 seconds (ref 11.4–15.2)

## 2017-07-18 NOTE — ED Notes (Signed)
ED Provider at bedside. 

## 2017-07-18 NOTE — ED Provider Notes (Signed)
[facility] EMERGENCY DEPARTMENT Provider Note   CSN: [id number] Arrival date & time: 07/18/17  1418     History   Chief Complaint Chief Complaint  Patient presents with  . Dizziness/HTN    HPI Paula Walls is a 84 y.o. female.  HPI  84 y/o female hx of CAD, HPL, HTN, thyroid d/o, UC, SA node dysfunction presents to the ED with fatigue x 2d with event x 48 hrs ago descriptive of vertigo lasting 6 hrs/then resolved.  Within the past 7d pt has resolution of URI/gout flare up.  Pt denies change in functional/cognitive status.  Pt denies CP/SOB/Fever,n/v/d, paresthesia/paralysis, recent trauma.    Past Medical History:  Diagnosis Date  . Chronotropic incompetence   . Coronary artery disease   . Diverticulosis   . Hyperlipidemia   . Hypertension   . Hypothyroidism   . Iron deficiency anemia   . MRSA (methicillin resistant Staphylococcus aureus)   . Sinoatrial node dysfunction (HCC)   . UC (ulcerative colitis) [facility])     Patient Active Problem List   Diagnosis Date Noted  . Fatigue 08/04/2012  . UNSPECIFIED HYPOTHYROIDISM 05/21/2008  . HYPERLIPIDEMIA-MIXED 05/21/2008  . UNSPECIFIED IRON DEFICIENCY ANEMIA 05/21/2008  . HYPERTENSION, UNSPECIFIED 05/21/2008  . CAD, NATIVE VESSEL 05/21/2008  . Sinoatrial node dysfunction ([place]) 05/21/2008  . PACEMAKER, PERMANENT 05/21/2008    Past Surgical History:  Procedure Laterality Date  . CARDIAC CATHETERIZATION  02/04/2004  . EP IMPLANTABLE DEVICE N/A 01/28/2016   Procedure: PPM Generator Changeout;  Surgeon: Deboraha Sprang, MD;  Location: [facility];  Service: Cardiovascular;  Laterality: N/A;  . PACEMAKER INSERTION  02/06/2004   [employer]  . THYROIDECTOMY, PARTIAL      OB History    No data available       Home Medications    Prior to Admission medications   Medication Sig Start Date End Date Taking? Authorizing Provider  amLODipine (NORVASC) 2.5 MG tablet Take 1 tablet (2.5 mg total) daily by  mouth. 04/19/17  Yes Deboraha Sprang, MD  Colchicine 0.6 MG CAPS Take 0.6 mg by mouth daily.  07/12/17  Yes [provider]  dextromethorphan-guaiFENesin (MUCINEX DM) 30-600 MG 12hr tablet Take 1 tablet by mouth 2 (two) times daily as needed for cough.   Yes [provider]  furosemide (LASIX) 20 MG tablet Take 1 tablet (20 mg total) every other day by mouth. Take one tablet (20 mg ) by mouth every other day 04/19/17  Yes Deboraha Sprang, MD  irbesartan (AVAPRO) 150 MG tablet Take 1 tablet (150 mg total) daily by mouth. 04/19/17  Yes Deboraha Sprang, MD  isosorbide mononitrate (IMDUR) 60 MG 24 hr tablet Take 1 tablet (60 mg total) daily by mouth. 04/19/17  Yes Deboraha Sprang, MD  levothyroxine (SYNTHROID, LEVOTHROID) 100 MCG tablet Take 1 tablet (100 mcg total) daily by mouth. 04/19/17  Yes Deboraha Sprang, MD  metoprolol succinate (TOPROL-XL) 50 MG 24 hr tablet Take 0.5 tablet by mouth daily with or immediately following a meal. 04/19/17  Yes Deboraha Sprang, MD  sodium chloride (OCEAN) 0.65 % SOLN nasal spray Place 2 sprays into both nostrils as needed for congestion.   Yes [provider]    Family History Family History  Problem Relation Age of Onset  . Stroke Mother   . Thyroid disease Mother   . Heart attack Father   . Fibromyalgia Daughter   . Arthritis Daughter  Social History Social History   Tobacco Use  . Smoking status: Never Smoker  . Smokeless tobacco: Never Used  Substance Use Topics  . Alcohol use: No  . Drug use: No     Allergies   Flagyl [metronidazole hcl]   Review of Systems Review of Systems  Constitutional: Negative for activity change, appetite change, chills and fever.  HENT: Negative for ear pain and sore throat.   Eyes: Negative for pain and visual disturbance.  Respiratory: Negative for cough and shortness of breath.   Cardiovascular: Negative for chest pain and palpitations.  Gastrointestinal: Negative for abdominal  pain and vomiting.  Genitourinary: Negative for dysuria and hematuria.  Musculoskeletal: Negative for arthralgias and back pain.  Skin: Negative for color change and rash.  Neurological: Negative for seizures and syncope.  All other systems reviewed and are negative.    Physical Exam Updated Vital Signs BP (!) 143/61   Pulse (!) 59   Temp 97.7 F (36.5 C) (Oral)   Resp 20   SpO2 94%   Physical Exam   ED Treatments / Results  Labs (all labs ordered are listed, but only abnormal results are displayed) Labs Reviewed  DIFFERENTIAL - Abnormal; Notable for the following components:      Result Value   Neutro Abs 7.9 (*)    All other components within normal limits  COMPREHENSIVE METABOLIC PANEL - Abnormal; Notable for the following components:   Glucose, Bld 113 (*)    BUN 22 (*)    Creatinine, Ser 1.35 (*)    GFR calc non Af Amer 35 (*)    GFR calc Af Amer 41 (*)    All other components within normal limits  URINALYSIS, ROUTINE W REFLEX MICROSCOPIC - Abnormal; Notable for the following components:   Leukocytes, UA SMALL (*)    Bacteria, UA RARE (*)    All other components within normal limits  PROTIME-INR  APTT  CBC  I-STAT TROPONIN, ED    EKG  EKG Interpretation  Date/Time:  Monday July 18 2017 14:33:15 EST Ventricular Rate:  62 PR Interval:  230 QRS Duration: 78 QT Interval:  446 QTC Calculation: 452 R Axis:   21 Text Interpretation:  Atrial-paced rhythm with prolonged AV conduction Nonspecific ST abnormality Confirmed by Lajean Saver [phone]) on 07/18/2017 9:32:04 PM       Radiology Ct Head Wo Contrast  Result Date: 07/18/2017 CLINICAL DATA:  Dizziness and elevated blood pressure, not feeling RIGHT for 2 days, increased fatigue, history coronary artery disease, hypertension EXAM: CT HEAD WITHOUT CONTRAST TECHNIQUE: Contiguous axial images were obtained from the base of the skull through the vertex without intravenous contrast. Sagittal and coronal MPR  images reconstructed from axial data set. COMPARISON:  None FINDINGS: Brain: Generalized atrophy. Normal ventricular morphology. No midline shift or mass effect. Small vessel chronic ischemic changes of deep cerebral white matter. No intracranial hemorrhage, mass lesion or definite evidence of acute infarction. No extra-axial fluid collections. Vascular: Atherosclerotic calcifications of internal carotid arteries at skull base Skull: Intact Sinuses/Orbits: Clear Other: N/A IMPRESSION: Atrophy with small vessel chronic ischemic changes of deep cerebral white matter. No acute intracranial abnormalities. Electronically Signed   By: Lavonia Dana M.D.   On: 07/18/2017 15:24    Procedures Procedures (including critical care time)  Medications Ordered in ED Medications - No data to display   Initial Impression / Assessment and Plan / ED Course  I have reviewed the triage vital signs and the nursing notes.  Pertinent  labs & imaging results that were available during my care of the patient were reviewed by me and considered in my medical decision making (see chart for details).     84 y/o female hx of CAD, HPL, HTN, thyroid d/o, UC, SA node dysfunction presents to the ED with fatigue x 2d with event x 48 hrs ago descriptive of vertigo lasting 6 hrs/then resolved.  Within the past 7d pt has resolution of URI/gout flare up.  Pt denies change in functional/cognitive status.  Pt denies CP/SOB/Fever,n/v/d, paresthesia/paralysis, recent trauma.  Physical exam without deficit/acute findings.  Labs unremarkable.  CT head without acute pathology. Pt asymptomatic throughout encounter.  Pt did state noncompliance with BP meds during the previously mentioned event.  Instructed pt to continue prescribed BP meds, f/u PCP with good return precautions.   Doubt infection, doubt ACS, doubt UTI, doubt CVA, possible resolved labyrinthitis with recent URI;    Final Clinical Impressions(s) / ED Diagnoses   Final diagnoses:    Weakness  Hypertension, unspecified type    ED Discharge Orders    None       Willette Alma, DO 07/18/17 2241    Lajean Saver, MD 07/19/17 1144

## 2017-07-18 NOTE — ED Triage Notes (Signed)
Patient complains of dizziness and BP running high and not feeling right x 2 days. States that she has increased fatigue and just feels out of it. No neuro deficits. Alert and oriented on arrival. Denies CP, no SOB.

## 2017-07-18 NOTE — Telephone Encounter (Signed)
New message    Patient daughter calling with BP concerns. Wants to know if medication needs additional adjustment.  Please call  Pt c/o BP issue: STAT if pt c/o blurred vision, one-sided weakness or slurred speech  1. What are your last 5 BP readings? 187/84, 168/84  2. Are you having any other symptoms (ex. Dizziness, headache, blurred vision, passed out)? dizziness  3. What is your BP issue? Feels BP too high

## 2017-07-18 NOTE — Telephone Encounter (Signed)
Spoke with patient regarding her BP  Over the weekend her BP was elevated.184/82 and 196/91 on Sunday (2/10), she was advised by Almyra Deforest, PA).   She took it this morning @ 7:30am (187/84) and then she took all of her morning medications.  She rechecked her BP @ 11am (168/84)..   She has been symptomatic (Dizzy when standing and had to lay down).  I advised her to phone her daughter and head over to the ED with such elevated Bps.   Patient verbalized understanding and will call daughter.

## 2017-07-19 NOTE — Telephone Encounter (Signed)
She will need to followup with her PCP for management of her BP-- o/w can see one of the EP APs  I am glad to talk with PCP Dr Edrick Oh about the orthostatics detected in the ER if that would be of help

## 2017-07-20 NOTE — Telephone Encounter (Signed)
Lpmtcb 2/13 md

## 2017-07-20 NOTE — Telephone Encounter (Signed)
Spoke with patient in regards to her making a follow up appt with either her PCP or an EP APP.Marland Kitchen    She said that she would like to follow up with [place] so I made her an appt (3/1 @ 1:00).Marland Kitchen

## 2017-07-28 ENCOUNTER — Encounter: Payer: Self-pay | Admitting: Physician Assistant

## 2017-08-05 ENCOUNTER — Ambulatory Visit (INDEPENDENT_AMBULATORY_CARE_PROVIDER_SITE_OTHER): Payer: Medicare Other | Admitting: Physician Assistant

## 2017-08-05 VITALS — BP 182/72 | HR 67 | Ht 60.0 in | Wt 142.0 lb

## 2017-08-05 DIAGNOSIS — I10 Essential (primary) hypertension: Secondary | ICD-10-CM

## 2017-08-05 DIAGNOSIS — I2510 Atherosclerotic heart disease of native coronary artery without angina pectoris: Secondary | ICD-10-CM | POA: Diagnosis not present

## 2017-08-05 DIAGNOSIS — I495 Sick sinus syndrome: Secondary | ICD-10-CM

## 2017-08-05 DIAGNOSIS — Z950 Presence of cardiac pacemaker: Secondary | ICD-10-CM

## 2017-08-05 MED ORDER — METOPROLOL SUCCINATE ER 50 MG PO TB24: ORAL_TABLET | ORAL | 3 refills | Status: DC

## 2017-08-05 MED ORDER — ISOSORBIDE MONONITRATE ER 60 MG PO TB24: 60.0000 mg | ORAL_TABLET | Freq: Every day | ORAL | 3 refills | Status: DC

## 2017-08-05 MED ORDER — IRBESARTAN 150 MG PO TABS: 150.0000 mg | ORAL_TABLET | Freq: Every day | ORAL | 3 refills | Status: DC

## 2017-08-05 MED ORDER — FUROSEMIDE 20 MG PO TABS: 20.0000 mg | ORAL_TABLET | ORAL | 3 refills | Status: DC

## 2017-08-05 NOTE — Patient Instructions (Signed)
Medication Instructions:    Your physician recommends that you continue on your current medications as directed. Please refer to the Current Medication list given to you today.   If you need a refill on your cardiac medications before your next appointment, please call your pharmacy.  Labwork: NONE ORDERED  TODAY    Testing/Procedures: NONE ORDERED  TODAY    Follow-Up:  IN 2 TO 3 WEEKS WITH HTN CLINIC FOR BLOOD PRESSURE MANAGEMENT    IN 4 MONTHS WITH URSUY OR KLEIN    Any Other Special Instructions Will Be Listed Below (If Applicable).

## 2017-08-05 NOTE — Progress Notes (Signed)
Cardiology Office Note Date:  08/05/2017  Patient ID:  Paula Walls, Paula Walls [birth year], MRN [id number] PCP:  Dione Housekeeper, MD  Electrophysiologist; Dr. Caryl Comes    Chief Complaint:  f/u ER visit, BP  History of Present Illness: Paula Walls is a 84 y.o. female with history of CAD, Noted by Dr. Caryl Comes, catheterization 2005 described high grade LAD diagonal bifurcation disease, catheterization 2009 demonstrated moderate diffuse disease also with normal ejection fraction. HTN, hypothyroidism, sinus node dysfunction w/PPM (known now to have no intrinsic atrial rhythm), hx of HFpEF, ARI (III).    She comes today to be seen for Dr. Caryl Comes, last saw him Nov 2018, she was doing well, no changes were made to her tx.  M[place]ed [place] done excessive walking with some SOB, though apparently with much more then her usual amount of exertional activity, not felt to represent any anginal or worrisome symptom.  ER visit 07/18/17, c/o fatigue, vertigo, had just gotten over URI/gout flare, not taking BP meds.  She comes today accompanied by her daughter.  The day of her ER visit they report that she had been taking Mucinex DM for a URI and colchicine for her gout, had been in a significant amount of pain.  She developed a sensation like she was standing on a boat, with a swaying type feeling, checked her BP and was 180's SBP.  She was initially instructed to take an addition BP pill and f/u though remained high and eventually referred to the ER.   She has been feeling better without recurrent symptoms, brings her home BP readings.  Generally they look OK higher numbers seem to be mostly prior to taking her meds, 150's/70's, there are a couple low 100's SBP that she reports feeling sluggish and tired with.  Overall though, not terrible control.  She denies any particular orthostatic symptoms.  No CP, palpitations, no near syncope or syncope.  She has some baseline DOE, unchanged for many  years.  Her initial BP here was 182/72 after ambulating in, recheck after several mionutes is 156/76, and she has not yet taken her meds today (is 1300 at her appt time).  Device information: SJM dual chamber PPM, implanted 2005, gen change 2017 Is atrial dependent  Past Medical History:  Diagnosis Date  . Chronotropic incompetence   . Coronary artery disease   . Diverticulosis   . Hyperlipidemia   . Hypertension   . Hypothyroidism   . Iron deficiency anemia   . MRSA (methicillin resistant Staphylococcus aureus)   . Sinoatrial node dysfunction (HCC)   . UC (ulcerative colitis) [facility])     Past Surgical History:  Procedure Laterality Date  . CARDIAC CATHETERIZATION  02/04/2004  . EP IMPLANTABLE DEVICE N/A 01/28/2016   Procedure: PPM Generator Changeout;  Surgeon: Deboraha Sprang, MD;  Location: [facility];  Service: Cardiovascular;  Laterality: N/A;  . PACEMAKER INSERTION  02/06/2004   [employer]  . THYROIDECTOMY, PARTIAL      Current Outpatient Medications  Medication Sig Dispense Refill  . amLODipine (NORVASC) 2.5 MG tablet Take 1 tablet (2.5 mg total) daily by mouth. 90 tablet 3  . Colchicine 0.6 MG CAPS Take 0.6 mg by mouth daily.     Marland Kitchen dextromethorphan-guaiFENesin (MUCINEX DM) 30-600 MG 12hr tablet Take 1 tablet by mouth 2 (two) times daily as needed for cough.    . furosemide (LASIX) 20 MG tablet Take 1 tablet (20 mg total) by mouth every other  day. Take one tablet (20 mg ) by mouth every other day 45 tablet 3  . irbesartan (AVAPRO) 150 MG tablet Take 1 tablet (150 mg total) by mouth daily. 90 tablet 3  . isosorbide mononitrate (IMDUR) 60 MG 24 hr tablet Take 1 tablet (60 mg total) by mouth daily. 90 tablet 3  . levothyroxine (SYNTHROID, LEVOTHROID) 100 MCG tablet Take 1 tablet (100 mcg total) daily by mouth. 90 tablet 3  . metoprolol succinate (TOPROL-XL) 50 MG 24 hr tablet Take 0.5 tablet by mouth daily with or immediately following a meal. 45 tablet 3  . sodium  chloride (OCEAN) 0.65 % SOLN nasal spray Place 2 sprays into both nostrils as needed for congestion.     No current facility-administered medications for this visit.     Allergies:   Flagyl [metronidazole hcl]   Social History:  The patient  reports that  has never smoked. she has never used smokeless tobacco. She reports that she does not drink alcohol or use drugs.   Family History:  The patient's family history includes Arthritis in her daughter; Fibromyalgia in her daughter; Heart attack in her father; Stroke in her mother; Thyroid disease in her mother.  ROS:  Please see the history of present illness.  All other systems are reviewed and otherwise negative.   PHYSICAL EXAM:  VS:  BP (!) 182/72   Pulse 67   Ht 5' (1.524 m)   Wt 142 lb (64.4 kg)   BMI 27.73 kg/m  BMI: Body mass index is 27.73 kg/m. Well nourished, well developed, in no acute distress  HEENT: normocephalic, atraumatic  Neck: no JVD, carotid bruits or masses Cardiac:  RRR; no significant murmurs, no rubs, or gallops Lungs:  CTA b/l, no wheezing, rhonchi or rales  Abd: soft, nontender MS: no deformity, age appropriate atrophy Ext: no edema  Skin: warm and dry, no rash Neuro:  No gross deficits appreciated Psych: euthymic mood, full affect   PPM site is stable, no tethering or discomfort   EKG:  Not done today PPM interrogation done today and reviewed by myself: battery and lead measurements are good, no device observations  09/24/15: Lexiscan stress  The left ventricular ejection fraction is hyperdynamic (>65%).  Nuclear stress EF: 68%.  There was no ST segment deviation noted during stress.  There is a small in size, mild in intensity, partially fixed defect in the mid anteroseptal area that most likely represents shifting breast attenuation artifact but cannot rule out a very small area of ischemia  This is a low risk study.  04/14/07: TTE SUMMARY - Overall left ventricular systolic function  was normal. Left    ventricular ejection fraction was estimated , range being 55    % to 65 %. There were no left ventricular regional wall    motion abnormalities. - There was mild aortic valvular regurgitation. - There was mild atheroma of the descending aorta. - There was mild mitral valvular regurgitation. - No interatrial flow by color Doppler. - There was a catheter seen in the right ventricle, without    evidence for associated vegetation or thrombus. - There was a catheter seen in the right atrium, without evidence    for associated vegetation or thrombus. IMPRESSIONS - No vegetations identified.  Recent Labs: 07/18/2017: ALT 32; BUN 22; Creatinine, Ser 1.35; Hemoglobin 12.3; Platelets 318; Potassium 4.7; Sodium 136  No results found for requested labs within last 8760 hours.   Estimated Creatinine Clearance: 26.5 mL/min (A) (by C-G  formula based on SCr of 1.35 mg/dL (H)).   Wt Readings from Last 3 Encounters:  08/05/17 142 lb (64.4 kg)  04/19/17 141 lb 12.8 oz (64.3 kg)  07/05/16 140 lb 6.4 oz (63.7 kg)     Other studies reviewed: Additional studies/records reviewed today include: summarized above  ASSESSMENT AND PLAN:  1. PPM     Intact function  2. HTN     Generally I dont think her control is too bad     She is a little inconsistent with timing of her meds, discussed the importance of this     Cautioned on OTC cough/cold remedies to check with her pharmacist if safe with HTN, and to monitor BP when taking  3. CAD     No anginal complaints     She reports severe myalgias with statin, not interested in revisiting lipid meds     On a nitrate, BB   Disposition: BP clinic in 1 mo, she will bring her home machine.  4 months otherwise, sooner if needed.   Noted after patient left, she is not on an ASA with charted hx of CAD.  I will ask MA to f/u with the patient to see if she has hx of an issue/problem with ASA, if not, start  ECASA 33m daily.    Current medicines are reviewed at length with the patient today.  The patient did not have any concerns regarding medicines.  SVenetia Night PA-C 08/05/2017 6:12 PM     [place] [phone](office)  [phone](fax)

## 2017-08-25 ENCOUNTER — Ambulatory Visit: Payer: Medicare Other

## 2017-08-25 NOTE — Progress Notes (Deleted)
Patient ID: Paula Walls                 DOB: 03-17-1932                      MRN: [id number]     HPI: Paula Walls is a 84 y.o. female patient of Dr. Caryl Comes who presents today for hypertension evaluation. PMH significant for CAD catheterization 2005 described high grade LAD diagonal bifurcation disease, catheterization 2009 demonstrated moderate diffuse disease also with normal ejection fraction, HTN, hypothyroidism, sinus node dysfunction w/PPM (known now to have no intrinsic atrial rhythm), hx of HFpEF, ARI (III). At most recent OV with [name], PA no medications changes were made, but she was advised to monitor and return with her cuff to HTN clinic in 1 month.   She presents today     Current HTN meds:  Amlodipine 2.85m daily  Furosemide 257mevery other day Irbesartan 1504maily Imdur 22m2mily  Metoprolol succinate 25mg42mly  Previously tried: valsartan (recall)  BP goal: <140/90 given age  Family History: Arthritis in her daughter; Fibromyalgia in her daughter; Heart attack in her father; Stroke in her mother; Thyroid disease in her mother  Social History: The patient  reports that  has never smoked. she has never used smokeless tobacco. She reports that she does not drink alcohol or use drugs.   Diet:   Exercise:   Home BP readings:   Wt Readings from Last 3 Encounters:  08/05/17 142 lb (64.4 kg)  04/19/17 141 lb 12.8 oz (64.3 kg)  07/05/16 140 lb 6.4 oz (63.7 kg)   BP Readings from Last 3 Encounters:  08/05/17 (!) 182/72  07/18/17 (!) 143/61  04/19/17 (!) 146/84   Pulse Readings from Last 3 Encounters:  08/05/17 67  07/18/17 (!) 59  04/19/17 60    Renal function: CrCl cannot be calculated (Patient's most recent lab result is older than the maximum 21 days allowed.).  Past Medical History:  Diagnosis Date  . Chronotropic incompetence   . Coronary artery disease   . Diverticulosis   . Hyperlipidemia   . Hypertension   . Hypothyroidism   .  Iron deficiency anemia   . MRSA (methicillin resistant Staphylococcus aureus)   . Sinoatrial node dysfunction (HCC)   . UC (ulcerative colitis) (HCC) [place] Current Outpatient Medications on File Prior to Visit  Medication Sig Dispense Refill  . amLODipine (NORVASC) 2.5 MG tablet Take 1 tablet (2.5 mg total) daily by mouth. 90 tablet 3  . Colchicine 0.6 MG CAPS Take 0.6 mg by mouth daily.     . dexMarland Kitchenromethorphan-guaiFENesin (MUCINEX DM) 30-600 MG 12hr tablet Take 1 tablet by mouth 2 (two) times daily as needed for cough.    . furosemide (LASIX) 20 MG tablet Take 1 tablet (20 mg total) by mouth every other day. Take one tablet (20 mg ) by mouth every other day 45 tablet 3  . irbesartan (AVAPRO) 150 MG tablet Take 1 tablet (150 mg total) by mouth daily. 90 tablet 3  . isosorbide mononitrate (IMDUR) 60 MG 24 hr tablet Take 1 tablet (60 mg total) by mouth daily. 90 tablet 3  . levothyroxine (SYNTHROID, LEVOTHROID) 100 MCG tablet Take 1 tablet (100 mcg total) daily by mouth. 90 tablet 3  . metoprolol succinate (TOPROL-XL) 50 MG 24 hr tablet Take 0.5 tablet by mouth daily with or immediately following a meal. 45 tablet 3  . sodium chloride (  OCEAN) 0.65 % SOLN nasal spray Place 2 sprays into both nostrils as needed for congestion.     No current facility-administered medications on file prior to visit.     Allergies  Allergen Reactions  . Flagyl [Metronidazole Hcl] Other (See Comments)    Extreme itch in head. Resembles lice    There were no vitals taken for this visit.   Assessment/Plan: Hypertension: BP increase metoprolol??   Thank you, Lelan Pons. Patterson Hammersmith, [facility]  08/25/2017 7:19 AM

## 2017-10-03 ENCOUNTER — Ambulatory Visit (INDEPENDENT_AMBULATORY_CARE_PROVIDER_SITE_OTHER): Payer: Medicare Other | Admitting: *Deleted

## 2017-10-03 DIAGNOSIS — I495 Sick sinus syndrome: Secondary | ICD-10-CM

## 2017-10-03 NOTE — Progress Notes (Signed)
Remote pacemaker transmission.   

## 2017-10-04 ENCOUNTER — Encounter: Payer: Self-pay | Admitting: Cardiology

## 2017-10-04 NOTE — Progress Notes (Signed)
Letter  

## 2017-10-21 LAB — CUP PACEART REMOTE DEVICE CHECK
Battery Voltage: 3.01 V
Brady Statistic AP VP Percent: 1 %
Brady Statistic AP VS Percent: 99 %
Brady Statistic AS VP Percent: 1 %
Brady Statistic AS VS Percent: 1 %
Date Time Interrogation Session: 20190429060015
Implantable Lead Implant Date: 20050901
Implantable Lead Location: 753859
Implantable Lead Location: 753860
Implantable Pulse Generator Implant Date: 20170823
Lead Channel Impedance Value: 480 Ohm
Lead Channel Pacing Threshold Amplitude: 0.5 V
Lead Channel Pacing Threshold Amplitude: 0.75 V
Lead Channel Pacing Threshold Pulse Width: 0.5 ms
Lead Channel Pacing Threshold Pulse Width: 0.5 ms
Lead Channel Setting Pacing Amplitude: 2.5 V
MDC IDC LEAD IMPLANT DT: 20050901
MDC IDC MSMT BATTERY REMAINING LONGEVITY: 115 mo
MDC IDC MSMT BATTERY REMAINING PERCENTAGE: 95.5 %
MDC IDC MSMT LEADCHNL RA SENSING INTR AMPL: 1.2 mV
MDC IDC MSMT LEADCHNL RV IMPEDANCE VALUE: 510 Ohm
MDC IDC MSMT LEADCHNL RV SENSING INTR AMPL: 12 mV
MDC IDC SET LEADCHNL RA PACING AMPLITUDE: 2 V
MDC IDC SET LEADCHNL RV PACING PULSEWIDTH: 0.5 ms
MDC IDC SET LEADCHNL RV SENSING SENSITIVITY: 2 mV
MDC IDC STAT BRADY RA PERCENT PACED: 99 %
MDC IDC STAT BRADY RV PERCENT PACED: 1 %
Pulse Gen Model: 2272
Pulse Gen Serial Number: 7942041

## 2017-12-13 ENCOUNTER — Encounter: Payer: Self-pay | Admitting: Internal Medicine

## 2017-12-13 ENCOUNTER — Encounter (INDEPENDENT_AMBULATORY_CARE_PROVIDER_SITE_OTHER): Payer: Self-pay

## 2017-12-13 ENCOUNTER — Ambulatory Visit (INDEPENDENT_AMBULATORY_CARE_PROVIDER_SITE_OTHER): Payer: Medicare Other | Admitting: Internal Medicine

## 2017-12-13 VITALS — BP 156/80 | HR 63 | Ht 60.0 in | Wt 146.0 lb

## 2017-12-13 DIAGNOSIS — Z950 Presence of cardiac pacemaker: Secondary | ICD-10-CM

## 2017-12-13 DIAGNOSIS — I2510 Atherosclerotic heart disease of native coronary artery without angina pectoris: Secondary | ICD-10-CM | POA: Diagnosis not present

## 2017-12-13 DIAGNOSIS — I495 Sick sinus syndrome: Secondary | ICD-10-CM

## 2017-12-13 LAB — CUP PACEART INCLINIC DEVICE CHECK
Date Time Interrogation Session: 20190709145128
Implantable Lead Implant Date: 20050901
Implantable Lead Implant Date: 20050901
Implantable Lead Location: 753859
Implantable Lead Location: 753860
MDC IDC PG IMPLANT DT: 20170823
Pulse Gen Model: 2272
Pulse Gen Serial Number: 7942041

## 2017-12-13 MED ORDER — AMLODIPINE BESYLATE 2.5 MG PO TABS: 2.5000 mg | ORAL_TABLET | Freq: Every day | ORAL | 3 refills | Status: DC

## 2017-12-13 NOTE — Patient Instructions (Signed)
Medication Instructions:  Your physician recommends that you continue on your current medications as directed. Please refer to the Current Medication list given to you today.  Labwork: None ordered.  Testing/Procedures: None ordered.  Follow-Up: Your physician wants you to follow-up in: One Year with Dr Caryl Comes. You will receive a reminder letter in the mail two months in advance. If you don't receive a letter, please call our office to schedule the follow-up appointment.  Remote monitoring is used to monitor your Pacemaker from home. This monitoring reduces the number of office visits required to check your device to one time per year. It allows [place] to keep an eye on the functioning of your device to ensure it is working properly. You are scheduled for a device check from home on 12/30/2017. You may send your transmission at any time that day. If you have a wireless device, the transmission will be sent automatically. After your physician reviews your transmission, you will receive a postcard with your next transmission date.    Any Other Special Instructions Will Be Listed Below (If Applicable).     If you need a refill on your cardiac medications before your next appointment, please call your pharmacy.

## 2017-12-13 NOTE — Progress Notes (Signed)
Patient Care Team: Dione Housekeeper, MD as PCP - General (Family Medicine)   HPI  Paula Walls is a 84 y.o. female is seen following pacemaker implantation for sinus node dysfunction. She underwent generator replacement 2017  She has no intrinsic atrial rhythm.  She has normal left ventricular function; by echo in 2008 and catheterization 2009 demonstrated moderate diffuse disease also with normal ejection fraction.  Catheterization 2005 described high grade LAD diagonal bifurcation disease        DATE TEST     4*/17 Myoview   EF 65 % No ischemia  Hyperdynamic function   Date Cr K  2/19 1.35 4.7          She denies chest pain.  She has some shortness of breath with exertion.  Denies peripheral edema.  She is also complaining of pain in her left hand and some weakness in her right hand.  There is little nocturnal numbness.    Past Medical History:  Diagnosis Date  . Chronotropic incompetence   . Coronary artery disease   . Diverticulosis   . Hyperlipidemia   . Hypertension   . Hypothyroidism   . Iron deficiency anemia   . MRSA (methicillin resistant Staphylococcus aureus)   . Sinoatrial node dysfunction (HCC)   . UC (ulcerative colitis) [facility])     Past Surgical History:  Procedure Laterality Date  . CARDIAC CATHETERIZATION  02/04/2004  . EP IMPLANTABLE DEVICE N/A 01/28/2016   Procedure: PPM Generator Changeout;  Surgeon: Deboraha Sprang, MD;  Location: [facility];  Service: Cardiovascular;  Laterality: N/A;  . PACEMAKER INSERTION  02/06/2004   St[employer] Jude  . THYROIDECTOMY, PARTIAL      Current Outpatient Medications  Medication Sig Dispense Refill  . amLODipine (NORVASC) 2.5 MG tablet Take 1 tablet (2.5 mg total) daily by mouth. 90 tablet 3  . furosemide (LASIX) 20 MG tablet Take 1 tablet (20 mg total) by mouth every other day. Take one tablet (20 mg ) by mouth every other day 45 tablet 3  . irbesartan (AVAPRO) 150 MG tablet Take 1 tablet (150 mg total)  by mouth daily. 90 tablet 3  . isosorbide mononitrate (IMDUR) 60 MG 24 hr tablet Take 1 tablet (60 mg total) by mouth daily. 90 tablet 3  . levothyroxine (SYNTHROID, LEVOTHROID) 100 MCG tablet Take 1 tablet (100 mcg total) daily by mouth. 90 tablet 3  . metoprolol succinate (TOPROL-XL) 50 MG 24 hr tablet Take 0.5 tablet by mouth daily with or immediately following a meal. 45 tablet 3   No current facility-administered medications for this visit.     Allergies  Allergen Reactions  . Flagyl [Metronidazole Hcl] Other (See Comments)    Extreme itch in head. Resembles lice    Review of Systems negative except from HPI and PMH  Physical Exam BP (!) 156/80   Pulse 63   Ht 5' (1.524 m)   Wt 146 lb (66.2 kg)   SpO2 92%   BMI 28.51 kg/m  Well developed and nourished in no acute distress HENT normal Neck supple with JVP-flat Clear Device pocket well healed; without hematoma or erythema.  There is no tethering  Regular rate and rhythm, no murmurs or gallops Abd-soft with active BS No Clubbing cyanosis edema Skin-warm and dry A & Oriented grossly wasting of her thenar eminence on her right hand with weakness in her right hand     ECG atrial pacing at 63 Intervals 23/11/41 Axis -  56 Voltage criteria for LVH your regular  Assessment and  Plan  Sinus node dysfunction/ chronotropic incompetence    Pacemaker-St. Jude The patient's device was interrogated.  The information was reviewed. No changes were made in the programming.     Hypertension    Dypsnea on Exertion   Coronary disease diffuse but non obstructive   HFpEF   Renal Insufficiency  Class 3 Cr 1.29  With GFR  40 or so     Dyspnea is likely related to her chronic HFpEF.  She is not volume overloaded.  We will continue her current medications.  No evidence of obstructive coronary disease.  Blood pressure is still elevated but she is having less orthostatic intolerance  She has significant weakness in her right  hand.  She has wasting of her thenar eminence.  I suggested that she follow-up with her PCP.  We spent more than 50% of our >25 min visit in face to face counseling regarding the above

## 2018-01-02 ENCOUNTER — Ambulatory Visit (INDEPENDENT_AMBULATORY_CARE_PROVIDER_SITE_OTHER): Payer: Medicare Other | Admitting: *Deleted

## 2018-01-02 DIAGNOSIS — I495 Sick sinus syndrome: Secondary | ICD-10-CM | POA: Diagnosis not present

## 2018-01-02 NOTE — Progress Notes (Signed)
Remote pacemaker transmission.   

## 2018-01-03 ENCOUNTER — Encounter: Payer: Self-pay | Admitting: Cardiology

## 2018-02-04 LAB — CUP PACEART REMOTE DEVICE CHECK
Battery Remaining Longevity: 117 mo
Brady Statistic AP VS Percent: 99 %
Brady Statistic AS VP Percent: 1 %
Brady Statistic RV Percent Paced: 1 %
Date Time Interrogation Session: 20190729060014
Implantable Lead Implant Date: 20050901
Lead Channel Impedance Value: 510 Ohm
Lead Channel Pacing Threshold Amplitude: 0.75 V
Lead Channel Sensing Intrinsic Amplitude: 12 mV
Lead Channel Setting Pacing Amplitude: 2 V
Lead Channel Setting Pacing Amplitude: 2.5 V
Lead Channel Setting Pacing Pulse Width: 0.5 ms
MDC IDC LEAD IMPLANT DT: 20050901
MDC IDC LEAD LOCATION: 753859
MDC IDC LEAD LOCATION: 753860
MDC IDC MSMT BATTERY REMAINING PERCENTAGE: 95.5 %
MDC IDC MSMT BATTERY VOLTAGE: 3.01 V
MDC IDC MSMT LEADCHNL RA PACING THRESHOLD AMPLITUDE: 0.5 V
MDC IDC MSMT LEADCHNL RA PACING THRESHOLD PULSEWIDTH: 0.5 ms
MDC IDC MSMT LEADCHNL RA SENSING INTR AMPL: 1.7 mV
MDC IDC MSMT LEADCHNL RV IMPEDANCE VALUE: 610 Ohm
MDC IDC MSMT LEADCHNL RV PACING THRESHOLD PULSEWIDTH: 0.5 ms
MDC IDC PG IMPLANT DT: 20170823
MDC IDC SET LEADCHNL RV SENSING SENSITIVITY: 2 mV
MDC IDC STAT BRADY AP VP PERCENT: 1 %
MDC IDC STAT BRADY AS VS PERCENT: 1 %
MDC IDC STAT BRADY RA PERCENT PACED: 99 %
Pulse Gen Serial Number: 7942041

## 2018-04-03 ENCOUNTER — Ambulatory Visit (INDEPENDENT_AMBULATORY_CARE_PROVIDER_SITE_OTHER): Payer: Medicare Other | Admitting: *Deleted

## 2018-04-03 DIAGNOSIS — I495 Sick sinus syndrome: Secondary | ICD-10-CM | POA: Diagnosis not present

## 2018-04-03 NOTE — Progress Notes (Signed)
Remote pacemaker transmission.   

## 2018-04-24 ENCOUNTER — Telehealth: Payer: Self-pay | Admitting: Internal Medicine

## 2018-04-24 NOTE — Telephone Encounter (Signed)
Pt's pharmacy is requesting a refill on levothyroxine 100 mcg. Would Dr. Caryl Comes like to refill this medication? Please address

## 2018-04-26 ENCOUNTER — Other Ambulatory Visit: Payer: Self-pay | Admitting: Physician Assistant

## 2018-04-26 NOTE — Telephone Encounter (Signed)
Called pt's pharmacy to inform them per Dr. Caryl Comes that pt needs to request refills from PCP. Pharmacy tech verbalized understanding.

## 2018-04-26 NOTE — Telephone Encounter (Signed)
Would prefer she get this done by her PCP who should be following her thyroid function

## 2018-05-05 ENCOUNTER — Other Ambulatory Visit: Payer: Self-pay | Admitting: Physician Assistant

## 2018-06-06 LAB — CUP PACEART REMOTE DEVICE CHECK
Brady Statistic AS VP Percent: 1 %
Brady Statistic RA Percent Paced: 98 %
Brady Statistic RV Percent Paced: 1 %
Date Time Interrogation Session: 20191028060023
Implantable Lead Implant Date: 20050901
Implantable Lead Location: 753860
Lead Channel Pacing Threshold Pulse Width: 0.5 ms
Lead Channel Sensing Intrinsic Amplitude: 12 mV
Lead Channel Setting Pacing Amplitude: 2 V
Lead Channel Setting Pacing Pulse Width: 0.5 ms
MDC IDC LEAD IMPLANT DT: 20050901
MDC IDC LEAD LOCATION: 753859
MDC IDC MSMT BATTERY REMAINING LONGEVITY: 116 mo
MDC IDC MSMT BATTERY REMAINING PERCENTAGE: 95.5 %
MDC IDC MSMT BATTERY VOLTAGE: 3.01 V
MDC IDC MSMT LEADCHNL RA IMPEDANCE VALUE: 510 Ohm
MDC IDC MSMT LEADCHNL RA PACING THRESHOLD AMPLITUDE: 0.5 V
MDC IDC MSMT LEADCHNL RA SENSING INTR AMPL: 1.3 mV
MDC IDC MSMT LEADCHNL RV IMPEDANCE VALUE: 560 Ohm
MDC IDC MSMT LEADCHNL RV PACING THRESHOLD AMPLITUDE: 0.75 V
MDC IDC MSMT LEADCHNL RV PACING THRESHOLD PULSEWIDTH: 0.5 ms
MDC IDC PG IMPLANT DT: 20170823
MDC IDC SET LEADCHNL RV PACING AMPLITUDE: 2.5 V
MDC IDC SET LEADCHNL RV SENSING SENSITIVITY: 2 mV
MDC IDC STAT BRADY AP VP PERCENT: 1 %
MDC IDC STAT BRADY AP VS PERCENT: 99 %
MDC IDC STAT BRADY AS VS PERCENT: 1.1 %
Pulse Gen Model: 2272
Pulse Gen Serial Number: 7942041

## 2018-07-03 ENCOUNTER — Ambulatory Visit (INDEPENDENT_AMBULATORY_CARE_PROVIDER_SITE_OTHER): Payer: Medicare Other

## 2018-07-03 DIAGNOSIS — I495 Sick sinus syndrome: Secondary | ICD-10-CM

## 2018-07-06 LAB — CUP PACEART REMOTE DEVICE CHECK
Battery Voltage: 3.01 V
Brady Statistic AP VP Percent: 1 %
Brady Statistic RA Percent Paced: 98 %
Brady Statistic RV Percent Paced: 1 %
Implantable Lead Implant Date: 20050901
Implantable Lead Implant Date: 20050901
Implantable Pulse Generator Implant Date: 20170823
Lead Channel Impedance Value: 610 Ohm
Lead Channel Pacing Threshold Amplitude: 0.5 V
Lead Channel Pacing Threshold Pulse Width: 0.5 ms
Lead Channel Pacing Threshold Pulse Width: 0.5 ms
Lead Channel Setting Pacing Amplitude: 2.5 V
Lead Channel Setting Sensing Sensitivity: 2 mV
MDC IDC LEAD LOCATION: 753859
MDC IDC LEAD LOCATION: 753860
MDC IDC MSMT BATTERY REMAINING LONGEVITY: 118 mo
MDC IDC MSMT BATTERY REMAINING PERCENTAGE: 95.5 %
MDC IDC MSMT LEADCHNL RA IMPEDANCE VALUE: 530 Ohm
MDC IDC MSMT LEADCHNL RA SENSING INTR AMPL: 1.9 mV
MDC IDC MSMT LEADCHNL RV PACING THRESHOLD AMPLITUDE: 0.75 V
MDC IDC MSMT LEADCHNL RV SENSING INTR AMPL: 12 mV
MDC IDC SESS DTM: 20200127070012
MDC IDC SET LEADCHNL RA PACING AMPLITUDE: 2 V
MDC IDC SET LEADCHNL RV PACING PULSEWIDTH: 0.5 ms
MDC IDC STAT BRADY AP VS PERCENT: 99 %
MDC IDC STAT BRADY AS VP PERCENT: 1 %
MDC IDC STAT BRADY AS VS PERCENT: 1.3 %
Pulse Gen Model: 2272
Pulse Gen Serial Number: 7942041

## 2018-08-15 ENCOUNTER — Other Ambulatory Visit: Payer: Self-pay | Admitting: Physician Assistant

## 2018-08-19 ENCOUNTER — Other Ambulatory Visit: Payer: Self-pay | Admitting: Physician Assistant

## 2018-10-02 ENCOUNTER — Other Ambulatory Visit: Payer: Self-pay

## 2018-10-02 ENCOUNTER — Ambulatory Visit (INDEPENDENT_AMBULATORY_CARE_PROVIDER_SITE_OTHER): Payer: Medicare Other | Admitting: *Deleted

## 2018-10-02 DIAGNOSIS — I495 Sick sinus syndrome: Secondary | ICD-10-CM | POA: Diagnosis not present

## 2018-10-02 DIAGNOSIS — I10 Essential (primary) hypertension: Secondary | ICD-10-CM

## 2018-10-02 LAB — CUP PACEART REMOTE DEVICE CHECK
Battery Remaining Longevity: 116 mo
Battery Remaining Percentage: 95.5 %
Battery Voltage: 3.01 V
Brady Statistic AP VP Percent: 1 %
Brady Statistic AP VS Percent: 99 %
Brady Statistic AS VP Percent: 1 %
Brady Statistic AS VS Percent: 1.3 %
Brady Statistic RA Percent Paced: 98 %
Brady Statistic RV Percent Paced: 1 %
Date Time Interrogation Session: 20200427084853
Implantable Lead Implant Date: 20050901
Implantable Lead Implant Date: 20050901
Implantable Lead Location: 753859
Implantable Lead Location: 753860
Implantable Pulse Generator Implant Date: 20170823
Lead Channel Impedance Value: 510 Ohm
Lead Channel Impedance Value: 560 Ohm
Lead Channel Pacing Threshold Amplitude: 0.5 V
Lead Channel Pacing Threshold Amplitude: 0.75 V
Lead Channel Pacing Threshold Pulse Width: 0.5 ms
Lead Channel Pacing Threshold Pulse Width: 0.5 ms
Lead Channel Sensing Intrinsic Amplitude: 1.1 mV
Lead Channel Sensing Intrinsic Amplitude: 12 mV
Lead Channel Setting Pacing Amplitude: 2 V
Lead Channel Setting Pacing Amplitude: 2.5 V
Lead Channel Setting Pacing Pulse Width: 0.5 ms
Lead Channel Setting Sensing Sensitivity: 2 mV
Pulse Gen Model: 2272
Pulse Gen Serial Number: 7942041

## 2018-10-10 NOTE — Progress Notes (Signed)
Remote pacemaker transmission.   

## 2018-11-09 ENCOUNTER — Other Ambulatory Visit: Payer: Self-pay | Admitting: Physician Assistant

## 2018-11-16 ENCOUNTER — Other Ambulatory Visit: Payer: Self-pay | Admitting: Physician Assistant

## 2018-12-17 ENCOUNTER — Other Ambulatory Visit: Payer: Self-pay | Admitting: Physician Assistant

## 2018-12-20 ENCOUNTER — Telehealth: Payer: Self-pay | Admitting: Internal Medicine

## 2018-12-20 NOTE — Telephone Encounter (Signed)
New message:     Patient calling to get a appt. Please call patient.

## 2018-12-25 ENCOUNTER — Telehealth: Payer: Self-pay | Admitting: Physician Assistant

## 2018-12-25 NOTE — Telephone Encounter (Signed)
New message    Pt c/o swelling: STAT is pt has developed SOB within 24 hours  1) How much weight have you gained and in what time span? Pt states she has not gained weight.  2) If swelling, where is the swelling located? R leg  3) Are you currently taking a fluid pill? Yes  4) Are you currently SOB? SOB when pt is up moving around  5) Do you have a log of your daily weights (if so, list)? Pt does not keep a log.  6) Have you gained 3 pounds in a day or 5 pounds in a week? No.  7) Have you traveled recently? No.     Pt daughter sent request for appt through MyChart because pt is having SOB, swelling in legs and has been having balance issues and falling more often. Pt daughter is concerned. Please call.

## 2018-12-25 NOTE — Telephone Encounter (Signed)
New Message ° ° ° °Left message to confirm appt and answer covid questions  °

## 2018-12-25 NOTE — Telephone Encounter (Signed)
   Appointment with Richardson Dopp, PA on July 21,2020   COVID-19 Pre-Screening Questions:  . In the past 7 to 10 days have you had a cough,  shortness of breath, headache, congestion, fever (100 or greater) body aches, chills, sore throat, or sudden loss of taste or sense of smell? Shortness of breath for 2 years. Seems to be worse in the last few months.  . Have you been around anyone with known Covid 19. no . Have you been around anyone who is awaiting Covid 19 test results in the past 7 to 10 days? no . Have you been around anyone who has been exposed to Covid 19, or has mentioned symptoms of Covid 19 within the past 7 to 10 days? no  If you have any concerns/questions about symptoms patients report during screening (either on the phone or at threshold). Contact the provider seeing the patient or DOD for further guidance.  If neither are available contact a member of the leadership team.     I spoke with pt's daughter who provided answers to above questions. Pt is hard of hearing and unsteady on her feet. Daughter will accompany pt to appointment. Daughter answers no to screening questions.  Daughter aware to arrive 15 minutes prior to appointment. Both pt and daughter have masks and will wear to appointment.  Pt's daughter reports pt has been short of breath for about 2 years. Seems to be worse in last few months. No chest pain. Pt is weak and has fallen at times. Pt has swelling in right leg from knee to ankle.  Present for about 2 weeks.  Daughter reports pt has been very inactive lately. She has tried to get her to walk but pt reports this causes her to be short of breath. I scheduled pt to see Richardson Dopp, PA tomorrow at 10:45. I advised daughter pt should go to ED if swelling or shortness of breath worsens.

## 2018-12-26 ENCOUNTER — Ambulatory Visit (INDEPENDENT_AMBULATORY_CARE_PROVIDER_SITE_OTHER): Payer: Medicare Other | Admitting: Family

## 2018-12-26 ENCOUNTER — Other Ambulatory Visit: Payer: Self-pay

## 2018-12-26 ENCOUNTER — Telehealth (HOSPITAL_COMMUNITY): Payer: Self-pay | Admitting: Radiology

## 2018-12-26 ENCOUNTER — Telehealth: Payer: Self-pay | Admitting: *Deleted

## 2018-12-26 ENCOUNTER — Encounter: Payer: Self-pay | Admitting: Family

## 2018-12-26 VITALS — BP 126/68 | HR 63 | Ht 60.0 in | Wt 145.0 lb

## 2018-12-26 DIAGNOSIS — N183 Chronic kidney disease, stage 3 unspecified: Secondary | ICD-10-CM

## 2018-12-26 DIAGNOSIS — R600 Localized edema: Secondary | ICD-10-CM

## 2018-12-26 DIAGNOSIS — R0609 Other forms of dyspnea: Secondary | ICD-10-CM

## 2018-12-26 DIAGNOSIS — R0602 Shortness of breath: Secondary | ICD-10-CM

## 2018-12-26 DIAGNOSIS — Z950 Presence of cardiac pacemaker: Secondary | ICD-10-CM

## 2018-12-26 DIAGNOSIS — E782 Mixed hyperlipidemia: Secondary | ICD-10-CM

## 2018-12-26 DIAGNOSIS — I25118 Atherosclerotic heart disease of native coronary artery with other forms of angina pectoris: Secondary | ICD-10-CM

## 2018-12-26 DIAGNOSIS — I495 Sick sinus syndrome: Secondary | ICD-10-CM

## 2018-12-26 DIAGNOSIS — I2510 Atherosclerotic heart disease of native coronary artery without angina pectoris: Secondary | ICD-10-CM | POA: Insufficient documentation

## 2018-12-26 DIAGNOSIS — M109 Gout, unspecified: Secondary | ICD-10-CM | POA: Insufficient documentation

## 2018-12-26 DIAGNOSIS — I10 Essential (primary) hypertension: Secondary | ICD-10-CM | POA: Diagnosis not present

## 2018-12-26 DIAGNOSIS — R7989 Other specified abnormal findings of blood chemistry: Secondary | ICD-10-CM

## 2018-12-26 LAB — PRO B NATRIURETIC PEPTIDE: NT-Pro BNP: 1210 pg/mL — ABNORMAL HIGH (ref 0–738)

## 2018-12-26 NOTE — Progress Notes (Signed)
Cardiology Office Note:    Date:  12/26/2018   ID:  Paula Walls, DOB August 11, 1931, MRN [id number]  PCP:  Paula Housekeeper, MD  Cardiologist:  No primary care provider on file.  Electrophysiologist:  Paula Axe, MD   Referring MD: Paula Housekeeper, MD   Chief Complaint: 85 yo female presents for chief complaint of shortness of breath and swelling.   History of Present Illness:    Paula Walls is a 85 y.o. female with a hx of sinus node dysfunction s/p PPM and generator replacement 2017, HTN, CAD, HFpEF, HLD last seen by Paula Walls 12/13/17. She has additional PMH of CKDIII, hypothyroidism, gout. Per Paula Walls last note she had cardiac cath 2005 with high grade LAD diagonal bifurcation disease, normal LVEF by echo 2008, and repeat cardiac cath 2009 with moderate diffuse disease and normal EF. She had a Lexiscan 09/2015 with EF 68%, low risk study.    She presents today with her daughter with chief complaint of shortness of breath and swelling.   Fell 2 weeks ago. States she "got up to fast". Has fallen 3 times in the last 6 months. Leads a mostly sedentary lifestyle. Enjoys her books and puzzles. Has fallen on both sides.   RLE swelling over the last 2 weeks. Denies erythema. Did fall on that side a few weeks ago. No numbness or tingling. No pain. States swelling gets worse throughout the day, but still present in the morning when she wakes up. Sits with her legs in dependent position most of the day. Her PAD screening is negative. She does have varicose veins on exam. She does not weigh herself daily.   Reports dyspnea on exertion. This is a chronic problem, but states it has been worsening over the last few months. Notices when she is trying to exercise or walking outside the home. Denies SOB at rest, no wheeze.   She denies chest pain, palpitations, PND. We discussed her recent lipid profile with elevated LDL of 160. She is statin intolerant - had "severe muscle aches" when she took in the  past. Initiated discussion regarding Zetia, but she is fearful of symptoms. Asked her to consider.   Past Medical History:  Diagnosis Date  . Chronotropic incompetence   . Coronary artery disease   . Diverticulosis   . Hyperlipidemia   . Hypertension   . Hypothyroidism   . Iron deficiency anemia   . MRSA (methicillin resistant Staphylococcus aureus)   . Sinoatrial node dysfunction (HCC)   . UC (ulcerative colitis) [facility])     Past Surgical History:  Procedure Laterality Date  . CARDIAC CATHETERIZATION  02/04/2004  . EP IMPLANTABLE DEVICE N/A 01/28/2016   Procedure: PPM Generator Changeout;  Surgeon: Deboraha Sprang, MD;  Location: [facility];  Service: Cardiovascular;  Laterality: N/A;  . PACEMAKER INSERTION  02/06/2004   St. Jude  . THYROIDECTOMY, PARTIAL      Current Medications: Current Meds  Medication Sig  . amLODipine (NORVASC) 2.5 MG tablet Take 1 tablet (2.5 mg total) by mouth daily. Please make yearly appt with Dr. Caryl Walls for July for future refills. 1st attempt  . colchicine 0.6 MG tablet Take 0.6 mg by mouth as needed (when have gout).  . furosemide (LASIX) 20 MG tablet Take 1 tablet (20 mg total) by mouth every other day. Take one tablet (20 mg ) by mouth every other day  . irbesartan (AVAPRO) 150 MG tablet TAKE 1 TABLET BY MOUTH EVERY DAY  .  isosorbide mononitrate (IMDUR) 60 MG 24 hr tablet TAKE 1 TABLET BY MOUTH EVERY DAY  . levothyroxine (SYNTHROID, LEVOTHROID) 100 MCG tablet Take 1 tablet (100 mcg total) daily by mouth.  . metoprolol succinate (TOPROL-XL) 50 MG 24 hr tablet TAKE 1/2 TABLET BY MOUTH DAILY WITH OR IMMEDIATELY FOLLOWING A MEAL.     Allergies:   Flagyl [metronidazole hcl]   Social History   Socioeconomic History  . Marital status: Widowed    Spouse name: Not on file  . Number of children: 2  . Years of education: Not on file  . Highest education level: Not on file  Occupational History  . Occupation: retired    Fish farm manager: [employer]   Social Needs  . Financial resource strain: Not on file  . Food insecurity    Worry: Not on file    Inability: Not on file  . Transportation needs    Medical: Not on file    Non-medical: Not on file  Tobacco Use  . Smoking status: Never Smoker  . Smokeless tobacco: Never Used  Substance and Sexual Activity  . Alcohol use: No  . Drug use: No  . Sexual activity: Not on file  Lifestyle  . Physical activity    Days per week: Not on file    Minutes per session: Not on file  . Stress: Not on file  Relationships  . Social Herbalist on phone: Not on file    Gets together: Not on file    Attends religious service: Not on file    Active member of club or organization: Not on file    Attends meetings of clubs or organizations: Not on file    Relationship status: Not on file  Other Topics Concern  . Not on file  Social History Narrative  . Not on file     Family History: The patient's family history includes Arthritis in her daughter; Fibromyalgia in her daughter; Heart attack in her father; Stroke in her mother; Thyroid disease in her mother.  ROS:   Please see the history of present illness.    Review of Systems  Constitution: Negative for chills, fever and malaise/fatigue.  Cardiovascular: Positive for dyspnea on exertion and leg swelling (RLE). Negative for chest pain, irregular heartbeat, near-syncope, palpitations and paroxysmal nocturnal dyspnea.  Respiratory: Negative for cough, shortness of breath and wheezing.   Gastrointestinal: Negative for nausea and vomiting.  Neurological: Positive for loss of balance (mulitple falls over last 6 months). Negative for dizziness, light-headedness and weakness.   All other systems reviewed and are negative.  EKGs/Labs/Other Studies Reviewed:    The following studies were reviewed today: Lexiscan 09/2015 The left ventricular ejection fraction is hyperdynamic (>65%). Nuclear stress EF: 68%. There was no ST segment  deviation noted during stress. There is a small in size, mild in intensity, partially fixed defect in the mid anteroseptal area that most likely represents shifting breast attenuation artifact but cannot rule out a very small area of ischemia This is a low risk study.   EKG:  EKG is  ordered today.  The ekg ordered today demonstrates atrial pacing, LAFB, LVH pstable when compared to previous.   Recent Labs:  11/17/18 via [facility]:  Hb 12.2  K 4.3 creatinine 1.41 GFR 34   Uric acid 8.9  TSH 0.332  Recent Lipid Panel 11/17/18 via [facility]: Total cholesterol 239, HDL 48, LDL 160, triglycerides 154  Physical Exam:    VS:  BP 126/68  Pulse 63   Ht 5' (1.524 m)   Wt 145 lb (65.8 kg)   BMI 28.32 kg/m     Wt Readings from Last 3 Encounters:  12/26/18 145 lb (65.8 kg)  12/13/17 146 lb (66.2 kg)  08/05/17 142 lb (64.4 kg)     GEN:  Well nourished, overweight, well developed in no acute distress HEENT: Normal NECK: No JVD; No carotid bruits LYMPHATICS: No lymphadenopathy CARDIAC: RRR, no murmurs, rubs, gallops RESPIRATORY:   Clear to auscultation without rales, wheezing or rhonchi  ABDOMEN: Soft, non-tender, non-distended MUSCULOSKELETAL:  RLE with non-pitting edema; no erythema. No deformity  SKIN: Warm and dry Varicose veins bilateral lower extremities. NEUROLOGIC:  Alert and oriented x 3 PSYCHIATRIC:  Normal affect   ASSESSMENT:    1. Dyspnea on exertion   2. Edema of right lower extremity   3. Coronary artery disease of native artery of native heart with stable angina pectoris ([place])   4. Essential hypertension   5. Mixed hyperlipidemia   6. Sinoatrial node dysfunction (HCC)   7. Presence of cardiac pacemaker   8. Chronic kidney disease, stage 3 ([place])   9. Shortness of breath    PLAN:    In order of problems listed above:  1. DOE - Progressing over the last 2 months. Noted with activity outside the home like shopping. No SOB at rest, no wheeze, no cough. Noted history of  HFpEF with last echocardiogram 2008. Repeat echocardiogram. ProBNP today. Continue present lasix dose. Etiology deconditioning versus HF. 2. Edema - RLE for the last 2 weeks. Worse throughout the day. Sits in dependent position during day. Will order ultrasound of bilateral LE to assess for DVT as she has had multiple falls recently and lives a sedentary lifestyle. Weight stable from 6 months ago. Etiology HF vs DVT vs venous insuffiency.  3. CAD - Last cath 2009 with "moderate diffuse disease". Denies chest pain. Continue CCB. No statin secondary to intolerance. Consider addition of aspirin 54m daily if she is willing. Encouraged her to begin exercising.  4. HFpEF - Noted history. Consider for etiology of DOE and edema. Echocardiogram ordered. ProBNP ordered.  5. HTN - BP well controlled today. Continue present anti-hypertensive regimen.  6. HLD - 11/17/18 total 239, HDL 48, LDL 160, triglycerides 154. Statin intolerant. Initiated discussion regarding Zetia. She is not agreeable at this time, but agrees to consider.  7. SA node dysfunction s/p PPM - Generator replacement 2017. Remote check 10/02/18 stable. EKG today stable.  8. CKD 3 - 11/17/18 creatinine 1.41 GFR 34. Careful titration of antihypertensives and diuretics.    Medication Adjustments/Labs and Tests Ordered: Current medicines are reviewed at length with the patient today.  Concerns regarding medicines are outlined above.  Orders Placed This Encounter  Procedures  . Pro b natriuretic peptide (BNP)  . EKG 12-Lead  . ECHOCARDIOGRAM COMPLETE   No orders of the defined types were placed in this encounter.   Patient Instructions  Medication Instructions:  Continue current medications.   If you need a refill on your cardiac medications before your next appointment, please call your pharmacy.   Lab work: ProBNP (to check fluid volume)  If you have labs (blood work) drawn today and your tests are completely normal, you will receive  your results only by: .Marland KitchenMyChart Message (if you have MyChart) OR . A paper copy in the mail If you have any lab test that is abnormal or we need to change your treatment, we will call  you to review the results.  Testing/Procedures: Echocardiogram (ultrasound of your heart) Ultrasound of your legs  Follow-Up: At [facility], you and your health needs are our priority.  As part of our continuing mission to provide you with exceptional heart care, we have created designated Provider Care Teams.  These Care Teams include your primary Cardiologist (physician) and Advanced Practice Providers (APPs -  Physician Assistants and Nurse Practitioners) who all work together to provide you with the care you need, when you need it. . Follow up with [name] as scheduled.   Any Other Special Instructions Will Be Listed Below (If Applicable).  Sit with legs elevated.   Watch salt in your diet as this can increase lower extremity swelling.   Exercise is an important part of cardiovascular health. Try to add some exercise into your regular routine.         Signed, Loel Dubonnet, NP  12/26/2018 11:55 AM    [place]

## 2018-12-26 NOTE — Patient Instructions (Addendum)
Medication Instructions:  Continue current medications.   If you need a refill on your cardiac medications before your next appointment, please call your pharmacy.   Lab work: ProBNP (to check fluid volume)  If you have labs (blood work) drawn today and your tests are completely normal, you will receive your results only by: Marland Kitchen MyChart Message (if you have MyChart) OR . A paper copy in the mail If you have any lab test that is abnormal or we need to change your treatment, we will call you to review the results.  Testing/Procedures: Echocardiogram (ultrasound of your heart) Ultrasound of your legs  Follow-Up: At [facility], you and your health needs are our priority.  As part of our continuing mission to provide you with exceptional heart care, we have created designated Provider Care Teams.  These Care Teams include your primary Cardiologist (physician) and Advanced Practice Providers (APPs -  Physician Assistants and Nurse Practitioners) who all work together to provide you with the care you need, when you need it. . Follow up with [name] as scheduled.   Any Other Special Instructions Will Be Listed Below (If Applicable).  Sit with legs elevated.   Watch salt in your diet as this can increase lower extremity swelling.   Exercise is an important part of cardiovascular health. Try to add some exercise into your regular routine.

## 2018-12-26 NOTE — Telephone Encounter (Signed)
Spoke with the pts daughter and informed her of the pts labs results and recommendations per Laurann Montana NP/[employer]. Confirmed with the pts daughter if the pt had enough lasix on hand at this time, with temporary dose increase, and she replied she yes.  Just made a note of temporary dose change of lasix the pt should take of 20 mg po daily for one week (7/22-7/29) then go back to lasix 20 mg po every other day thereafter. Scheduled the pt to have a repeat pro-bnp and bmet for next week 7/29, same day as her echo appt.  Advised the pts daughter to have her arrive a few mins early, prior to her scheduled echo appt at 12:45 pm. Daughter and pt both verbalized understanding and agrees with this plan. Daughter was very gracious for all the assistance provided.

## 2018-12-26 NOTE — Telephone Encounter (Signed)
Left message to call office-Patient needs to schedule an echocardiogram.

## 2018-12-26 NOTE — Telephone Encounter (Signed)
-----   Message from Loel Dubonnet, NP sent at 12/26/2018  5:09 PM EDT ----- Please call patient's daughter (she is DPR and primary contact)  ProBNP elevated. Change Lasix to 2m DAILY (previously every other day) for one week (Wednesday 7/22-Wednesday 7/29). Then return to every other day.   Will repeat ProBNP, BMP in 1 week. She has echo scheduled for 01/03/19 so she can have labs drawn that day.

## 2018-12-26 NOTE — Progress Notes (Signed)
Pro BNP 1,210 today.   Increase Lasix to 4m DAILY (previously every other day) for 1 week.  Recheck ProBNP, BMP on 01/03/19 after 1 week to reassess fluid volume, electrolytes, kidney function (Hx CKD3).   Follow up previously scheduled with RTommye Standard8/3/20.

## 2018-12-27 ENCOUNTER — Other Ambulatory Visit: Payer: Self-pay | Admitting: Family

## 2018-12-27 DIAGNOSIS — R600 Localized edema: Secondary | ICD-10-CM

## 2019-01-01 ENCOUNTER — Ambulatory Visit (INDEPENDENT_AMBULATORY_CARE_PROVIDER_SITE_OTHER): Payer: Medicare Other | Admitting: *Deleted

## 2019-01-01 DIAGNOSIS — I495 Sick sinus syndrome: Secondary | ICD-10-CM | POA: Diagnosis not present

## 2019-01-02 LAB — CUP PACEART REMOTE DEVICE CHECK
Battery Remaining Longevity: 117 mo
Battery Remaining Percentage: 95.5 %
Battery Voltage: 2.99 V
Brady Statistic AP VP Percent: 1 %
Brady Statistic AP VS Percent: 99 %
Brady Statistic AS VP Percent: 1 %
Brady Statistic AS VS Percent: 1.1 %
Brady Statistic RA Percent Paced: 98 %
Brady Statistic RV Percent Paced: 1 %
Date Time Interrogation Session: 20200727060013
Implantable Lead Implant Date: 20050901
Implantable Lead Implant Date: 20050901
Implantable Lead Location: 753859
Implantable Lead Location: 753860
Implantable Pulse Generator Implant Date: 20170823
Lead Channel Impedance Value: 530 Ohm
Lead Channel Impedance Value: 610 Ohm
Lead Channel Pacing Threshold Amplitude: 0.5 V
Lead Channel Pacing Threshold Amplitude: 0.75 V
Lead Channel Pacing Threshold Pulse Width: 0.5 ms
Lead Channel Pacing Threshold Pulse Width: 0.5 ms
Lead Channel Sensing Intrinsic Amplitude: 1.1 mV
Lead Channel Sensing Intrinsic Amplitude: 12 mV
Lead Channel Setting Pacing Amplitude: 2 V
Lead Channel Setting Pacing Amplitude: 2.5 V
Lead Channel Setting Pacing Pulse Width: 0.5 ms
Lead Channel Setting Sensing Sensitivity: 2 mV
Pulse Gen Model: 2272
Pulse Gen Serial Number: 7942041

## 2019-01-03 ENCOUNTER — Encounter (HOSPITAL_COMMUNITY): Payer: Self-pay

## 2019-01-03 ENCOUNTER — Other Ambulatory Visit: Payer: Self-pay

## 2019-01-03 ENCOUNTER — Ambulatory Visit (HOSPITAL_BASED_OUTPATIENT_CLINIC_OR_DEPARTMENT_OTHER): Payer: Medicare Other

## 2019-01-03 ENCOUNTER — Ambulatory Visit (HOSPITAL_COMMUNITY)
Admission: RE | Admit: 2019-01-03 | Discharge: 2019-01-03 | Disposition: A | Discharge status: Home / Self Care | Payer: Medicare Other | Source: Ambulatory Visit | Attending: Internal Medicine | Admitting: Internal Medicine

## 2019-01-03 ENCOUNTER — Other Ambulatory Visit: Payer: Medicare Other

## 2019-01-03 DIAGNOSIS — I25118 Atherosclerotic heart disease of native coronary artery with other forms of angina pectoris: Secondary | ICD-10-CM

## 2019-01-03 DIAGNOSIS — N183 Chronic kidney disease, stage 3 unspecified: Secondary | ICD-10-CM

## 2019-01-03 DIAGNOSIS — I10 Essential (primary) hypertension: Secondary | ICD-10-CM

## 2019-01-03 DIAGNOSIS — R0609 Other forms of dyspnea: Secondary | ICD-10-CM

## 2019-01-03 DIAGNOSIS — R7989 Other specified abnormal findings of blood chemistry: Secondary | ICD-10-CM

## 2019-01-03 DIAGNOSIS — Z950 Presence of cardiac pacemaker: Secondary | ICD-10-CM

## 2019-01-03 DIAGNOSIS — R600 Localized edema: Secondary | ICD-10-CM | POA: Diagnosis present

## 2019-01-03 NOTE — Progress Notes (Unsigned)
The right lower venous has been completed and is negative for DVT. There is a cystic structure at the distal thigh to just below the knee measuring about 9.4 cm. Preliminary results can be found under CV proc through chart review.  Wilkie Aye RVT [facility]

## 2019-01-04 ENCOUNTER — Other Ambulatory Visit: Payer: Self-pay | Admitting: *Deleted

## 2019-01-04 DIAGNOSIS — M25861 Other specified joint disorders, right knee: Secondary | ICD-10-CM

## 2019-01-04 LAB — BASIC METABOLIC PANEL
BUN/Creatinine Ratio: 19 (ref 12–28)
BUN: 33 mg/dL — ABNORMAL HIGH (ref 8–27)
CO2: 20 mmol/L (ref 20–29)
Calcium: 9.1 mg/dL (ref 8.7–10.3)
Chloride: 102 mmol/L (ref 96–106)
Creatinine, Ser: 1.73 mg/dL — ABNORMAL HIGH (ref 0.57–1.00)
GFR calc Af Amer: 31 mL/min/{1.73_m2} — ABNORMAL LOW (ref >59)
GFR calc non Af Amer: 27 mL/min/{1.73_m2} — ABNORMAL LOW (ref >59)
Glucose: 98 mg/dL (ref 65–99)
Potassium: 4.5 mmol/L (ref 3.5–5.2)
Sodium: 139 mmol/L (ref 134–144)

## 2019-01-04 LAB — PRO B NATRIURETIC PEPTIDE: NT-Pro BNP: 1097 pg/mL — ABNORMAL HIGH (ref 0–738)

## 2019-01-05 ENCOUNTER — Telehealth: Payer: Self-pay

## 2019-01-05 NOTE — Telephone Encounter (Signed)
No answer at either number to do covid-19 screening questions

## 2019-01-06 NOTE — Progress Notes (Signed)
Cardiology Office Note Date:  01/06/2019  Patient ID:  Paula, Walls 1931/11/18, MRN [id number] PCP:  Dione Housekeeper, MD  Electrophysiologist; Dr. Caryl Comes    Chief Complaint:  f/u ER visit, BP  History of Present Illness: Paula Walls is a 85 y.o. female with history of CAD, Noted by Dr. Caryl Comes, catheterization 2005 described high grade LAD diagonal bifurcation disease, catheterization 2009 demonstrated moderate diffuse disease also with normal ejection fraction. HTN, hypothyroidism, sinus node dysfunction w/PPM (known now to have no intrinsic atrial rhythm), hx of HFpEF, ARI (III).    ER visit 07/18/17, c/o fatigue, vertigo, had just gotten over URI/gout flare, not taking BP meds. The day of her ER visit they report that she had been taking Mucinex DM for a URI and colchicine for her gout, had been in a significant amount of pain.  She developed a sensation like she was standing on a boat, with a swaying type feeling, checked her BP and was 180's SBP.  She was initially instructed to take an addition BP pill and f/u though remained high and eventually referred to the ER.   She was seen recently by C.Walker, NP 12/26/2018 for recent recurrent falls, edema and SOB.  In review of the note, "Fell 2 weeks ago. States she "got up to fast". Has fallen 3 times in the last 6 months. Leads a mostly sedentary lifestyle. Enjoys her books and puzzles. Has fallen on both sides.  RLE swelling over the last 2 weeks. Denies erythema. Did fall on that side a few weeks ago. No numbness or tingling. No pain. States swelling gets worse throughout the day, but still present in the morning when she wakes up. Sits with her legs in dependent position most of the day. Her PAD screening is negative. She does have varicose veins on exam. She does not weigh herself daily.  Reports dyspnea on exertion. This is a chronic problem, but states it has been worsening over the last few months. Notices when she is trying to  exercise or walking outside the home. Denies SOB at rest, no wheeze."  Suspect HFpEF, planned for an echo, BNP, and LE venous US.  Discussed perhaps adding ASA to her regime given CAD and lipid lowering therapy, this the patient again declined with h/o myalgias.  BNP was 1210 and lasix 72m was increased to daily from QOD and recheck labs in week BNP 1097 , Creat up to 1.73 (from 1.35 a year prior) Her irbisartan  and lasix held for 2 days then to resume usual dosing LVEF >65%, mild LVH, impaired relaxation, no WMA, no significant VHD RLE was neg for DVT + for cystic structure and referred to orthopedics, suspect this was her RLE etiology  She comes today for follow up  She has not fallen since her last visit, or felt like she would.  No dizziness, near syncope or syncope, though today mentions she feels a bit "off", maybe slightly lightheaded, though not like she is weak, not like her vertigo either.  No CP, palpitations.  She denies any ongoing SOB, denies symptoms of orthopnea or PND.  She has a consult with the orthopedic specialist in place.  She has started with some erythema to her R foot that is typical for her gout.  Perhaps the lasix, though daughter also states she does not particularly avoid the foods she is supposed to.   Device information: SJM dual chamber PPM, implanted 2005, gen change 2017 Is atrial dependent  Past  Medical History:  Diagnosis Date  . Chronotropic incompetence   . Coronary artery disease   . Diverticulosis   . Hyperlipidemia   . Hypertension   . Hypothyroidism   . Iron deficiency anemia   . MRSA (methicillin resistant Staphylococcus aureus)   . Sinoatrial node dysfunction (HCC)   . UC (ulcerative colitis) [facility])     Past Surgical History:  Procedure Laterality Date  . CARDIAC CATHETERIZATION  02/04/2004  . EP IMPLANTABLE DEVICE N/A 01/28/2016   Procedure: PPM Generator Changeout;  Surgeon: Deboraha Sprang, MD;  Location: [place] [facility];  Service:  Cardiovascular;  Laterality: N/A;  . PACEMAKER INSERTION  02/06/2004   [employer]  . THYROIDECTOMY, PARTIAL      Current Outpatient Medications  Medication Sig Dispense Refill  . amLODipine (NORVASC) 2.5 MG tablet Take 1 tablet (2.5 mg total) by mouth daily. Please make yearly appt with Dr. Caryl Comes for July for future refills. 1st attempt 90 tablet 0  . colchicine 0.6 MG tablet Take 0.6 mg by mouth as needed (when have gout).    . furosemide (LASIX) 20 MG tablet Take 1 tablet (20 mg total) by mouth every other day. Take one tablet (20 mg ) by mouth every other day 45 tablet 3  . irbesartan (AVAPRO) 150 MG tablet TAKE 1 TABLET BY MOUTH EVERY DAY 90 tablet 1  . isosorbide mononitrate (IMDUR) 60 MG 24 hr tablet TAKE 1 TABLET BY MOUTH EVERY DAY 90 tablet 1  . levothyroxine (SYNTHROID, LEVOTHROID) 100 MCG tablet Take 1 tablet (100 mcg total) daily by mouth. 90 tablet 3  . metoprolol succinate (TOPROL-XL) 50 MG 24 hr tablet TAKE 1/2 TABLET BY MOUTH DAILY WITH OR IMMEDIATELY FOLLOWING A MEAL. 15 tablet 0   No current facility-administered medications for this visit.     Allergies:   Flagyl [metronidazole hcl]   Social History:  The patient  reports that she has never smoked. She has never used smokeless tobacco. She reports that she does not drink alcohol or use drugs.   Family History:  The patient's family history includes Arthritis in her daughter; Fibromyalgia in her daughter; Heart attack in her father; Stroke in her mother; Thyroid disease in her mother.  ROS:  Please see the history of present illness.  All other systems are reviewed and otherwise negative.   PHYSICAL EXAM:  VS:  There were no vitals taken for this visit. BMI: There is no height or weight on file to calculate BMI. Well nourished, well developed, in no acute distress  HEENT: normocephalic, atraumatic  Neck: no JVD, carotid bruits or masses Cardiac:  RRR; no significant murmurs, no rubs, or gallops Lungs:  CTA b/l, no  wheezing, rhonchi or rales  Abd: soft, nontender MS: no deformity, age appropriate atrophy Ext:  no edema  Skin: warm and dry, no rash Neuro:  No gross deficits appreciated Psych: euthymic mood, full affect   PPM site is stable, no tethering or discomfort   EKG:  Not done today PPM interrogation done today and reviewed by myself:  Battery and lead measurements are stable, one AMS back in oct 2019, 8 seconds, AFlutter, none otherwise none   01/03/2019: LE venous US (RIGHT) Summary: Right:  No evidence of deep vein thrombosis in the lower extremity. No indirect evidence of obstruction proximal to the inguinal ligament. A cystic structure is found in the distal thigh to just below the knee and measured approximately 9.4 cm.  Left: No evidence of common femoral  vein obstruction.   01/03/2019 TTE IMPRESSIONS  1. The left ventricle has hyperdynamic systolic function, with an ejection fraction of >65%. The cavity size was normal. There is mildly increased left ventricular wall thickness. Left ventricular diastolic Doppler parameters are consistent with  impaired relaxation. Elevated left atrial and left ventricular end-diastolic pressures The E/e' is >15. No evidence of left ventricular regional wall motion abnormalities.  2. The right ventricle has normal systolic function. The cavity was normal. There is no increase in right ventricular wall thickness.  3. Left atrial size was mildly dilated.  4. The mitral valve is abnormal. Mild thickening of the mitral valve leaflet.  5. The tricuspid valve is grossly normal.  6. The aortic valve is tricuspid. Mild sclerosis of the aortic valve. Aortic valve regurgitation is trivial by color flow Doppler. No stenosis of the aortic valve.  7. The aorta is normal in size and structure.  SUMMARY   LVEF >65%, mild LVH, normal wall motion, grade 1 DD, elevated LV filling pressure, mild LAE, aortic valve sclerosis with trivial AI, trivial MR, mild  TR, RVSP 27 mmHg, normal IVC  FINDINGS  Left Ventricle: The left ventricle has hyperdynamic systolic function, with an ejection fraction of >65%. The cavity size was normal. There is mildly increased left ventricular wall thickness. Left ventricular diastolic Doppler parameters are consistent  with impaired relaxation. Elevated left atrial and left ventricular end-diastolic pressures The E/e' is >15. No evidence of left ventricular regional wall motion abnormalities..  Right Ventricle: The right ventricle has normal systolic function. The cavity was normal. There is no increase in right ventricular wall thickness.  Left Atrium: Left atrial size was mildly dilated.  Right Atrium: Right atrial size was normal in size. Right atrial pressure is estimated at 10 mmHg.  Interatrial Septum: No atrial level shunt detected by color flow Doppler.  Pericardium: There is no evidence of pericardial effusion.  Mitral Valve: The mitral valve is abnormal. Mild thickening of the mitral valve leaflet. Mitral valve regurgitation is trivial by color flow Doppler.  Tricuspid Valve: The tricuspid valve is grossly normal. Tricuspid valve regurgitation is mild by color flow Doppler.  Aortic Valve: The aortic valve is tricuspid Mild sclerosis of the aortic valve. Aortic valve regurgitation is trivial by color flow Doppler. There is No stenosis of the aortic valve.  Pulmonic Valve: The pulmonic valve was grossly normal. Pulmonic valve regurgitation is trivial by color flow Doppler.  Aorta: The aorta is normal in size and structure.  Venous: The inferior vena cava measures 0.70 cm, is normal in size with greater than 50% respiratory variability.   09/24/15: Lexiscan stress  The left ventricular ejection fraction is hyperdynamic (>65%).  Nuclear stress EF: 68%.  There was no ST segment deviation noted during stress.  There is a small in size, mild in intensity, partially fixed defect in the mid  anteroseptal area that most likely represents shifting breast attenuation artifact but cannot rule out a very small area of ischemia  This is a low risk study.  04/14/07: TTE SUMMARY - Overall left ventricular systolic function was normal. Left    ventricular ejection fraction was estimated , range being 55    % to 65 %. There were no left ventricular regional wall    motion abnormalities. - There was mild aortic valvular regurgitation. - There was mild atheroma of the descending aorta. - There was mild mitral valvular regurgitation. - No interatrial flow by color Doppler. - There was a catheter seen  in the right ventricle, without    evidence for associated vegetation or thrombus. - There was a catheter seen in the right atrium, without evidence    for associated vegetation or thrombus. IMPRESSIONS - No vegetations identified.  Recent Labs: 01/03/2019: BUN 33; Creatinine, Ser 1.73; NT-Pro BNP 1,097; Potassium 4.5; Sodium 139  No results found for requested labs within last 8760 hours.   Estimated Creatinine Clearance: 20.5 mL/min (A) (by C-G formula based on SCr of 1.73 mg/dL (H)).   Wt Readings from Last 3 Encounters:  12/26/18 145 lb (65.8 kg)  12/13/17 146 lb (66.2 kg)  08/05/17 142 lb (64.4 kg)     Other studies reviewed: Additional studies/records reviewed today include: summarized above  ASSESSMENT AND PLAN:  1. PPM     Intact function, no programming changes made  2. HTN     She comes in with BP on the lowish side today, orthostatics done today are slightly positive  3. CAD     No anginal complaints     She reports severe myalgias with statin, not interested in revisiting lipid meds     On a nitrate, BB  4. RLE edema     Not appreciated today, ortho eval scheduled for cyst noted on venous US     They will discuss her gout with him as well       5. SOB/DOE 6. HFpEF     Follow BNP remained elevated, though I do not  appreciate symptoms or exam findings of fluid OL today     BMET today to follow up     Discussed daily weights, minimizing sodium intake   7. Falls in the last 6 months     Daughter noted a general decline in energy, not as active as she used to be, this a slow decline over about 6 mo     Unclear etiology for this     At least one fall sounded orthostatic.   She takes all her meds together in the am I have asked her to split up her BP meds (she has h/o  ER visit 2/2 HTN so don't want to stop any yet) Tale in the AM Imdur and Metoprolol PM Avapro and Amlodipine  Daily BP checks        Disposition: 2 weeks with BP clinic and me in 6-8 weeks can move out of feeling well and not needed.   Noted after patient left, she is not on an ASA with charted hx of CAD.  I will ask MA to f/u with the patient to see if she has hx of an issue/problem with ASA, if not, start ECASA 55m daily.    Current medicines are reviewed at length with the patient today.  The patient did not have any concerns regarding medicines.  SVenetia Night PA-C 01/06/2019 6:42 AM     [facility] [address] [place] [phone](office)  ([phone](fax)

## 2019-01-08 ENCOUNTER — Other Ambulatory Visit: Payer: Self-pay

## 2019-01-08 ENCOUNTER — Ambulatory Visit (INDEPENDENT_AMBULATORY_CARE_PROVIDER_SITE_OTHER): Payer: Medicare Other | Admitting: Physician Assistant

## 2019-01-08 ENCOUNTER — Encounter: Payer: Self-pay | Admitting: Physician Assistant

## 2019-01-08 VITALS — BP 108/68 | HR 65 | Ht 60.0 in | Wt 143.0 lb

## 2019-01-08 DIAGNOSIS — I10 Essential (primary) hypertension: Secondary | ICD-10-CM

## 2019-01-08 DIAGNOSIS — Z950 Presence of cardiac pacemaker: Secondary | ICD-10-CM | POA: Diagnosis not present

## 2019-01-08 DIAGNOSIS — I2510 Atherosclerotic heart disease of native coronary artery without angina pectoris: Secondary | ICD-10-CM | POA: Diagnosis not present

## 2019-01-08 DIAGNOSIS — N183 Chronic kidney disease, stage 3 unspecified: Secondary | ICD-10-CM

## 2019-01-08 DIAGNOSIS — I5032 Chronic diastolic (congestive) heart failure: Secondary | ICD-10-CM

## 2019-01-08 LAB — BASIC METABOLIC PANEL
BUN/Creatinine Ratio: 22 (ref 12–28)
BUN: 35 mg/dL — ABNORMAL HIGH (ref 8–27)
CO2: 21 mmol/L (ref 20–29)
Calcium: 9.6 mg/dL (ref 8.7–10.3)
Chloride: 102 mmol/L (ref 96–106)
Creatinine, Ser: 1.58 mg/dL — ABNORMAL HIGH (ref 0.57–1.00)
GFR calc Af Amer: 34 mL/min/{1.73_m2} — ABNORMAL LOW (ref >59)
GFR calc non Af Amer: 30 mL/min/{1.73_m2} — ABNORMAL LOW (ref >59)
Glucose: 100 mg/dL — ABNORMAL HIGH (ref 65–99)
Potassium: 4.8 mmol/L (ref 3.5–5.2)
Sodium: 139 mmol/L (ref 134–144)

## 2019-01-08 NOTE — Patient Instructions (Signed)
Medication Instructions:   TAKE IN AM  TOPROL  IMDUR   TAKE IN PM   AMLODIPINE  IRBESARTAN   If you need a refill on your cardiac medications before your next appointment, please call your pharmacy.   Lab work: BMET TODAY   If you have labs (blood work) drawn today and your tests are completely normal, you will receive your results only by: Marland Kitchen MyChart Message (if you have MyChart) OR . A paper copy in the mail If you have any lab test that is abnormal or we need to change your treatment, we will call you to review the results.  Testing/Procedures: NONE ORDERED  TODAY   Follow-Up:  IN 2 WEEKS WITH BLOOD PRESSURE CLINIC  IN 6- 8 WEEKS WITH RENEE URSUY   Any Other Special Instructions Will Be Listed Below (If Applicable).

## 2019-01-09 ENCOUNTER — Ambulatory Visit (INDEPENDENT_AMBULATORY_CARE_PROVIDER_SITE_OTHER): Payer: Medicare Other

## 2019-01-09 ENCOUNTER — Ambulatory Visit (INDEPENDENT_AMBULATORY_CARE_PROVIDER_SITE_OTHER): Payer: Medicare Other | Admitting: Family

## 2019-01-09 ENCOUNTER — Encounter: Payer: Self-pay | Admitting: Family

## 2019-01-09 VITALS — Ht 60.0 in | Wt 143.0 lb

## 2019-01-09 DIAGNOSIS — M25561 Pain in right knee: Secondary | ICD-10-CM

## 2019-01-09 DIAGNOSIS — M1A0720 Idiopathic chronic gout, left ankle and foot, without tophus (tophi): Secondary | ICD-10-CM

## 2019-01-10 ENCOUNTER — Encounter: Payer: Self-pay | Admitting: Family

## 2019-01-10 DIAGNOSIS — M25561 Pain in right knee: Secondary | ICD-10-CM | POA: Diagnosis not present

## 2019-01-10 MED ORDER — METHYLPREDNISOLONE ACETATE 40 MG/ML IJ SUSP
40.0000 mg | INTRAMUSCULAR | Status: AC | PRN
  Administered 2019-01-10: 40 mg via INTRA_ARTICULAR

## 2019-01-10 MED ORDER — LIDOCAINE HCL 1 % IJ SOLN
5.0000 mL | INTRAMUSCULAR | Status: AC | PRN
  Administered 2019-01-10: 17:00:00 5 mL

## 2019-01-10 NOTE — Progress Notes (Signed)
Office Visit Note   Patient: Paula Walls           Date of Birth: [birth year]           MRN: [id number] Visit Date: 01/09/2019              Requested by: Loel Dubonnet, NP [address] [place],  [place] [address] PCP: Dione Housekeeper, MD  Chief Complaint  Patient presents with  . Right Knee - Pain    Np; Right Knee       HPI: Patient is an 85 year old woman who presents complaining of right knee pain.  She states she went to the emergency room had an ultrasound obtained and was told she had a Baker's cyst.  Pain primarily in the right knee globally.  Patient also states she has a history of gout currently she states she has a flareup of gout in her left foot.  She is taking colchicine 0.6 mg a day she is not on allopurinol.  Patient states she only drinks sodas and sugar drinks.  Assessment & Plan: Visit Diagnoses:  1. Right knee pain, unspecified chronicity     Plan: Recommended water for drinking she may flavor this with fruit.  The purpose is to increase her urinary output to decrease her gout symptoms.  She tolerated the knee injection well discussed that if she has recurrent symptoms to follow-up.  Follow-Up Instructions: No follow-ups on file.   Ortho Exam  Patient is alert, oriented, no adenopathy, well-dressed, normal affect, normal respiratory effort. Examination patient has difficulty getting from a sitting to a standing position.  There is some fullness in the popliteal fossa on the right consistent with a Baker's cyst.  Right knee is tender to palpation of the medial and lateral joint line as well as in the patellofemoral joint there is crepitation with range of motion collaterals and cruciates are stable she has no mechanical locking.  She has some redness on the dorsal medial aspect of the left foot she states that this is improving.  Imaging: No results found. No images are attached to the encounter.  Labs: Lab Results  Component Value Date   ESRSEDRATE  55 (H) 04/12/2007   REPTSTATUS 04/15/2007 FINAL 04/14/2007   CULT INSIGNIFICANT GROWTH 04/12/2007     Lab Results  Component Value Date   ALBUMIN 3.5 07/18/2017    No results found for: MG No results found for: VD25OH  No results found for: PREALBUMIN CBC EXTENDED Latest Ref Rng & Units 07/18/2017 01/23/2016 02/13/2008  WBC 4.0 - 10.5 K/uL 9.8 6.9 5.9  RBC 3.87 - 5.11 MIL/uL 4.13 3.99 4.14  HGB 12.0 - 15.0 g/dL 12.3 11.6(L) 12.8  HCT 36.0 - 46.0 % 37.1 34.8(L) 37.6  PLT 150 - 400 K/uL 318 234 206  NEUTROABS 1.7 - 7.7 K/uL 7.9(H) 4,485 4.0  LYMPHSABS 0.7 - 4.0 K/uL 1.3 1,449 -     Body mass index is 27.93 kg/m.  Orders:  Orders Placed This Encounter  Procedures  . XR Knee 1-2 Views Right   No orders of the defined types were placed in this encounter.    Procedures: Large Joint Inj: R knee on 01/10/2019 4:35 PM Indications: pain and diagnostic evaluation Details: 22 G 1.5 in needle, anteromedial approach  Arthrogram: No  Medications: 5 mL lidocaine 1 %; 40 mg methylPREDNISolone acetate 40 MG/ML Outcome: tolerated well, no immediate complications Procedure, treatment alternatives, risks and benefits explained, specific risks discussed. Consent was given by  the patient. Immediately prior to procedure a time out was called to verify the correct patient, procedure, equipment, support staff and site/side marked as required. Patient was prepped and draped in the usual sterile fashion.      Clinical Data: No additional findings.  ROS:  All other systems negative, except as noted in the HPI. Review of Systems  Objective: Vital Signs: Ht 5' (1.524 m)   Wt 143 lb (64.9 kg)   BMI 27.93 kg/m   Specialty Comments:  No specialty comments available.  PMFS History: Patient Active Problem List   Diagnosis Date Noted  . Chronic kidney disease, stage 3 ([place]) 12/26/2018  . Coronary artery disease 12/26/2018  . Gout 12/26/2018  . Dyspnea on exertion 12/26/2018  . Edema  of right lower extremity 12/26/2018  . Fatigue 08/04/2012  . Hypothyroidism 05/21/2008  . Hyperlipidemia 05/21/2008  . UNSPECIFIED IRON DEFICIENCY ANEMIA 05/21/2008  . Essential hypertension 05/21/2008  . CAD, NATIVE VESSEL 05/21/2008  . Sinoatrial node dysfunction ([place]) 05/21/2008  . Presence of cardiac pacemaker 05/21/2008   Past Medical History:  Diagnosis Date  . Chronotropic incompetence   . Coronary artery disease   . Diverticulosis   . Hyperlipidemia   . Hypertension   . Hypothyroidism   . Iron deficiency anemia   . MRSA (methicillin resistant Staphylococcus aureus)   . Sinoatrial node dysfunction (HCC)   . UC (ulcerative colitis) ([place])     Family History  Problem Relation Age of Onset  . Stroke Mother   . Thyroid disease Mother   . Heart attack Father   . Fibromyalgia Daughter   . Arthritis Daughter     Past Surgical History:  Procedure Laterality Date  . CARDIAC CATHETERIZATION  02/04/2004  . EP IMPLANTABLE DEVICE N/A 01/28/2016   Procedure: PPM Generator Changeout;  Surgeon: Deboraha Sprang, MD;  Location: [facility];  Service: Cardiovascular;  Laterality: N/A;  . PACEMAKER INSERTION  02/06/2004   [employer]. Jude  . THYROIDECTOMY, PARTIAL     Social History   Occupational History  . Occupation: retired    Fish farm manager: [employer]  Tobacco Use  . Smoking status: Never Smoker  . Smokeless tobacco: Never Used  Substance and Sexual Activity  . Alcohol use: No  . Drug use: No  . Sexual activity: Not on file

## 2019-01-12 ENCOUNTER — Other Ambulatory Visit: Payer: Self-pay | Admitting: Physician Assistant

## 2019-01-15 ENCOUNTER — Telehealth: Payer: Self-pay

## 2019-01-15 NOTE — Telephone Encounter (Signed)
No note needed 

## 2019-01-16 ENCOUNTER — Ambulatory Visit: Payer: Medicare Other | Admitting: Physician Assistant

## 2019-01-18 NOTE — Progress Notes (Signed)
Remote pacemaker transmission.   

## 2019-01-31 ENCOUNTER — Other Ambulatory Visit: Payer: Self-pay

## 2019-01-31 ENCOUNTER — Ambulatory Visit (INDEPENDENT_AMBULATORY_CARE_PROVIDER_SITE_OTHER): Payer: Medicare Other | Admitting: Pharmacist

## 2019-01-31 ENCOUNTER — Encounter: Payer: Self-pay | Admitting: Pharmacist

## 2019-01-31 VITALS — BP 115/50 | HR 77

## 2019-01-31 DIAGNOSIS — I10 Essential (primary) hypertension: Secondary | ICD-10-CM

## 2019-01-31 DIAGNOSIS — I2510 Atherosclerotic heart disease of native coronary artery without angina pectoris: Secondary | ICD-10-CM

## 2019-01-31 NOTE — Patient Instructions (Addendum)
It was a pleasure to meet you today.  Please stop taking amlodipine. Continue checking your blood pressure at home. If it starts to consistently run >130/80 please give [place] a call at [phone].

## 2019-01-31 NOTE — Progress Notes (Signed)
Patient ID: Paula Walls                 DOB: 09-Aug-1931                      MRN: [id number]     HPI: Paula Walls is a 85 y.o. female patient of Dr. Caryl Comes referred by [name] to HTN clinic. PMH is significant for PPM, HTN, CAD, RLE edema, SOB. HFpEF, falls. At last visit with [facility] on 01/08/2019, patient noted to be slightly orthostatic. She has had multiple falls, at least one sounded orthostatic per note. She was asked to space her medications apart imdur and metoprolol in the AM and Avapro and amlodipine in the PM.   Patient presents to clinic today for follow up. She denies recent falls, dizziness, lightheadedness, headaches or blurred vision. She states that she was separating the medications as requested, but  didn't notice any difference so she is now taking them together again.  Patient brought in home monitor. Blood pressure on home monitor was about 8-10 points higher systolic than clinic cuff and about 6 points higher for diastolic.  Current HTN meds: amlodipine 2.22m daily, furosemide 258mevery other day, irbesartan 15069maily, isosorbide 69m28mily, metoprolol succinate 25mg80mly BP goal: <130/80  Family History: The patient's family history includes Arthritis in her daughter; Fibromyalgia in her daughter; Heart attack in her father; Stroke in her mother; Thyroid disease in her mother  Social History: The patient  reports that she has never smoked. She has never used smokeless tobacco. She reports that she does not drink alcohol or use drugs.   Home BP readings: wasn't really sure but has been checking. Her daughter does it for her.  Wt Readings from Last 3 Encounters:  01/09/19 143 lb (64.9 kg)  01/08/19 143 lb (64.9 kg)  12/26/18 145 lb (65.8 kg)   BP Readings from Last 3 Encounters:  01/08/19 108/68  12/26/18 126/68  12/13/17 (!) 156/80   Pulse Readings from Last 3 Encounters:  01/08/19 65  12/26/18 63  12/13/17 63    Renal function: CrCl cannot be  calculated (Patient's most recent lab result is older than the maximum 21 days allowed.).  Past Medical History:  Diagnosis Date  . Chronotropic incompetence   . Coronary artery disease   . Diverticulosis   . Hyperlipidemia   . Hypertension   . Hypothyroidism   . Iron deficiency anemia   . MRSA (methicillin resistant Staphylococcus aureus)   . Sinoatrial node dysfunction (HCC)   . UC (ulcerative colitis) (HCC) [place] Current Outpatient Medications on File Prior to Visit  Medication Sig Dispense Refill  . amLODipine (NORVASC) 2.5 MG tablet Take 1 tablet (2.5 mg total) by mouth daily. Please make yearly appt with Dr. KleinCaryl ComesJuly for future refills. 1st attempt 90 tablet 0  . colchicine 0.6 MG tablet Take 0.6 mg by mouth as needed (when have gout).    . furosemide (LASIX) 20 MG tablet Take 1 tablet (20 mg total) by mouth every other day. Take one tablet (20 mg ) by mouth every other day 45 tablet 3  . irbesartan (AVAPRO) 150 MG tablet TAKE 1 TABLET BY MOUTH EVERY DAY 90 tablet 1  . isosorbide mononitrate (IMDUR) 60 MG 24 hr tablet TAKE 1 TABLET BY MOUTH EVERY DAY 90 tablet 1  . levothyroxine (SYNTHROID, LEVOTHROID) 100 MCG tablet Take 1 tablet (100 mcg total) daily by mouth. 90[place]  tablet 3  . metoprolol succinate (TOPROL-XL) 50 MG 24 hr tablet TAKE 1/2 TABLET BY MOUTH DAILY WITH OR IMMEDIATELY FOLLOWING A MEAL. 45 tablet 3   No current facility-administered medications on file prior to visit.     Allergies  Allergen Reactions  . Flagyl [Metronidazole Hcl] Other (See Comments)    Extreme itch in head. Resembles lice    There were no vitals taken for this visit.   Assessment/Plan:  1. Hypertension - Blood pressure is at goal of <130/80. She denies recent dizziness. Says she is fine when she takes her time standing up. No orthostasis noted today in clinic. Blood pressure remained the same. HR increased appropriately. With history of falls and decently low blood pressure, I am opting to  discontinue amlodipine 2.44m. Continue furosemide 261mevery other day, irbesartan 15043maily, isosorbide 36m38mily, metoprolol succinate 25mg68mly. Patient to continue to monitor BP at home. Follow up as needed in HTN clinic.   Thank you  MelisRamond Dialrm.D, BCPS [id number]: [phone]: [phone]

## 2019-02-01 ENCOUNTER — Other Ambulatory Visit: Payer: Self-pay | Admitting: Physician Assistant

## 2019-02-01 MED ORDER — FUROSEMIDE 20 MG PO TABS: 20.0000 mg | ORAL_TABLET | ORAL | 3 refills | Status: DC

## 2019-02-06 ENCOUNTER — Other Ambulatory Visit: Payer: Self-pay | Admitting: Physician Assistant

## 2019-02-07 ENCOUNTER — Telehealth: Payer: Self-pay | Admitting: Internal Medicine

## 2019-02-07 MED ORDER — AMLODIPINE BESYLATE 2.5 MG PO TABS: 2.5000 mg | ORAL_TABLET | Freq: Every day | ORAL | 1 refills | Status: DC

## 2019-02-07 NOTE — Telephone Encounter (Signed)
Returned call to patient's daughter, Abigail Butts. She states pt never stopped her amlodipine as advised at last visit. States pt did not realize at that time that amlodipine was the medication she stopped before ending up in the ER with elevated BP in February 2019 (BP regimen was the same at that time as it is currently). She is fearful of stopping amlodipine especially since she is going out of town for her birthday. Pt has not checked her BP at all since visit last week.    Discussed that it was a low dose of amlodipine that was stopped due to soft BP in clinic and history of falls. Advised pt that we can send in a refill for amlodipine, however encouraged to monitor her BP at home. Advised her to cut her amlodipine in half (1.32m) if she experiences dizziness or if her systolic BP at home drops to < 1237mg (home cuff read 8-106m higher than clinic reading last week). She will call back with any other updates prior to her appt with Dr SkaMarlou Porch October.

## 2019-02-07 NOTE — Telephone Encounter (Signed)
Pt's daughter calling stating that pt was seen in the hypertension clinic on 01/31/19 and was taken off Amlodipine and pt stated that she was not going to stop taking this medicine because she is scared of ending up in the ED like before. Pt's daughter would like a call back concerning this matter at [phone], because they are about to go out of town and pt did not want to run out of the Amlodipine. Please address

## 2019-02-07 NOTE — Telephone Encounter (Signed)
I spoke to the patient's daughter Abigail Butts) [phone] who called because the patient is reluctant to stop Amlodipine as advised on 8/26.  About 1 year ago, she stopped the medication and her BP rose to a point that she had to go to the ED.  She does not want to face that again.    [place] 87th birthday [place] and needs a refill before then.  The patient would like further advisement.

## 2019-02-12 NOTE — Progress Notes (Deleted)
Cardiology Office Note Date:  02/12/2019  Patient ID:  Paula Walls, Paula Walls 1932/03/03, MRN [id number] PCP:  Dione Housekeeper, MD  Electrophysiologist; Dr. Caryl Comes    Chief Complaint:  f/u ER visit, BP  History of Present Illness: Paula Walls is a 85 y.o. female with history of CAD, Noted by Dr. Caryl Comes, catheterization 2005 described high grade LAD diagonal bifurcation disease, catheterization 2009 demonstrated moderate diffuse disease also with normal ejection fraction. HTN, hypothyroidism, sinus node dysfunction w/PPM (known now to have no intrinsic atrial rhythm), hx of HFpEF, ARI (III).    ER visit 07/18/17, c/o fatigue, vertigo, had just gotten over URI/gout flare, not taking BP meds. The day of her ER visit they report that she had been taking Mucinex DM for a URI and colchicine for her gout, had been in a significant amount of pain.  She developed a sensation like she was standing on a boat, with a swaying type feeling, checked her BP and was 180's SBP.  She was initially instructed to take an addition BP pill and f/u though remained high and eventually referred to the ER.   She was seen recently by C.Walker, NP 12/26/2018 for recent recurrent falls, edema and SOB.  In review of the note, "Fell 2 weeks ago. States she "got up to fast". Has fallen 3 times in the last 6 months. Leads a mostly sedentary lifestyle. Enjoys her books and puzzles. Has fallen on both sides.  RLE swelling over the last 2 weeks. Denies erythema. Did fall on that side a few weeks ago. No numbness or tingling. No pain. States swelling gets worse throughout the day, but still present in the morning when she wakes up. Sits with her legs in dependent position most of the day. Her PAD screening is negative. She does have varicose veins on exam. She does not weigh herself daily.  Reports dyspnea on exertion. This is a chronic problem, but states it has been worsening over the last few months. Notices when she is trying to  exercise or walking outside the home. Denies SOB at rest, no wheeze."  Suspect HFpEF, planned for an echo, BNP, and LE venous US.  Discussed perhaps adding ASA to her regime given CAD and lipid lowering therapy, this the patient again declined with h/o myalgias.  BNP was 1210 and lasix 51m was increased to daily from QOD and recheck labs in week BNP 1097 , Creat up to 1.73 (from 1.35 a year prior) Her irbisartan  and lasix held for 2 days then to resume usual dosing LVEF >65%, mild LVH, impaired relaxation, no WMA, no significant VHD RLE was neg for DVT + for cystic structure and referred to orthopedics, suspect this was her RLE etiology  I saw her 01/08/2019 She had not fallen since her last visit, or felt like she would.  No dizziness, near syncope or syncope, though today mentioned she felt a bit "off", maybe slightly lightheaded, though not like she is weak, not like her vertigo either.  No CP, palpitations.  She denied any ongoing SOB, denies symptoms of orthopnea or PND.  She hasd a consult with the orthopedic specialist in place for evaluation of backers cyst (and gout)  She had slightly + orthostatic VS and recommended that she split up her meds rather then take all at once and f/u with BP clinic.  She has had h/o ER visits with HTN ooc so did not want to stop any just yet.  She was not felt  to be clearly volume OL at this visit.  She did see the BP clinic, [place] mention no ongoing symptoms, pt had seperated her meds though felt no change/improvement and was taking them all again in the mornings.  Mentioned negative orthostatics, and decently low BP her amlodipine stopped.  *** symptoms *** meds *** orthostatic? *** volume status *** ortho?   Device information: SJM dual chamber PPM, implanted 2005, gen change 2017 Is atrial dependent  Past Medical History:  Diagnosis Date  . Chronotropic incompetence   . Coronary artery disease   . Diverticulosis   . Hyperlipidemia   .  Hypertension   . Hypothyroidism   . Iron deficiency anemia   . MRSA (methicillin resistant Staphylococcus aureus)   . Sinoatrial node dysfunction (HCC)   . UC (ulcerative colitis) [facility])     Past Surgical History:  Procedure Laterality Date  . CARDIAC CATHETERIZATION  02/04/2004  . EP IMPLANTABLE DEVICE N/A 01/28/2016   Procedure: PPM Generator Changeout;  Surgeon: Deboraha Sprang, MD;  Location: [facility];  Service: Cardiovascular;  Laterality: N/A;  . PACEMAKER INSERTION  02/06/2004   [employer]  . THYROIDECTOMY, PARTIAL      Current Outpatient Medications  Medication Sig Dispense Refill  . amLODipine (NORVASC) 2.5 MG tablet Take 1 tablet (2.5 mg total) by mouth daily. 90 tablet 1  . colchicine 0.6 MG tablet Take 0.6 mg by mouth as needed (when have gout).    . furosemide (LASIX) 20 MG tablet Take 1 tablet (20 mg total) by mouth every other day. Take one tablet (20 mg ) by mouth every other day 45 tablet 3  . irbesartan (AVAPRO) 150 MG tablet TAKE 1 TABLET BY MOUTH EVERY DAY 90 tablet 3  . isosorbide mononitrate (IMDUR) 60 MG 24 hr tablet TAKE 1 TABLET BY MOUTH EVERY DAY 90 tablet 3  . levothyroxine (SYNTHROID, LEVOTHROID) 100 MCG tablet Take 1 tablet (100 mcg total) daily by mouth. 90 tablet 3  . metoprolol succinate (TOPROL-XL) 50 MG 24 hr tablet TAKE 1/2 TABLET BY MOUTH DAILY WITH OR IMMEDIATELY FOLLOWING A MEAL. 45 tablet 3   No current facility-administered medications for this visit.     Allergies:   Flagyl [metronidazole hcl]   Social History:  The patient  reports that she has never smoked. She has never used smokeless tobacco. She reports that she does not drink alcohol or use drugs.   Family History:  The patient's family history includes Arthritis in her daughter; Fibromyalgia in her daughter; Heart attack in her father; Stroke in her mother; Thyroid disease in her mother.  ROS:  Please see the history of present illness.  All other systems are reviewed and  otherwise negative.   PHYSICAL EXAM:  VS:  There were no vitals taken for this visit. BMI: There is no height or weight on file to calculate BMI. Well nourished, well developed, in no acute distress  HEENT: normocephalic, atraumatic  Neck: no JVD, carotid bruits or masses Cardiac:  *** RRR; no significant murmurs, no rubs, or gallops Lungs:  *** CTA b/l, no wheezing, rhonchi or rales  Abd: soft, nontender MS: no deformity, age appropriate atrophy Ext:  *** no edema  Skin: warm and dry, no rash Neuro:  No gross deficits appreciated Psych: euthymic mood, full affect  *** PPM site is stable, no tethering or discomfort   EKG:  Not done today PPM interrogation done today and reviewed by myself:  ***   01/03/2019: LE venous  US (RIGHT) Summary: Right:  No evidence of deep vein thrombosis in the lower extremity. No indirect evidence of obstruction proximal to the inguinal ligament. A cystic structure is found in the distal thigh to just below the knee and measured approximately 9.4 cm.  Left: No evidence of common femoral vein obstruction.   01/03/2019 TTE IMPRESSIONS  1. The left ventricle has hyperdynamic systolic function, with an ejection fraction of >65%. The cavity size was normal. There is mildly increased left ventricular wall thickness. Left ventricular diastolic Doppler parameters are consistent with  impaired relaxation. Elevated left atrial and left ventricular end-diastolic pressures The E/e' is >15. No evidence of left ventricular regional wall motion abnormalities.  2. The right ventricle has normal systolic function. The cavity was normal. There is no increase in right ventricular wall thickness.  3. Left atrial size was mildly dilated.  4. The mitral valve is abnormal. Mild thickening of the mitral valve leaflet.  5. The tricuspid valve is grossly normal.  6. The aortic valve is tricuspid. Mild sclerosis of the aortic valve. Aortic valve regurgitation is trivial by  color flow Doppler. No stenosis of the aortic valve.  7. The aorta is normal in size and structure.  SUMMARY   LVEF >65%, mild LVH, normal wall motion, grade 1 DD, elevated LV filling pressure, mild LAE, aortic valve sclerosis with trivial AI, trivial MR, mild TR, RVSP 27 mmHg, normal IVC  FINDINGS  Left Ventricle: The left ventricle has hyperdynamic systolic function, with an ejection fraction of >65%. The cavity size was normal. There is mildly increased left ventricular wall thickness. Left ventricular diastolic Doppler parameters are consistent  with impaired relaxation. Elevated left atrial and left ventricular end-diastolic pressures The E/e' is >15. No evidence of left ventricular regional wall motion abnormalities..  Right Ventricle: The right ventricle has normal systolic function. The cavity was normal. There is no increase in right ventricular wall thickness.  Left Atrium: Left atrial size was mildly dilated.  Right Atrium: Right atrial size was normal in size. Right atrial pressure is estimated at 10 mmHg.  Interatrial Septum: No atrial level shunt detected by color flow Doppler.  Pericardium: There is no evidence of pericardial effusion.  Mitral Valve: The mitral valve is abnormal. Mild thickening of the mitral valve leaflet. Mitral valve regurgitation is trivial by color flow Doppler.  Tricuspid Valve: The tricuspid valve is grossly normal. Tricuspid valve regurgitation is mild by color flow Doppler.  Aortic Valve: The aortic valve is tricuspid Mild sclerosis of the aortic valve. Aortic valve regurgitation is trivial by color flow Doppler. There is No stenosis of the aortic valve.  Pulmonic Valve: The pulmonic valve was grossly normal. Pulmonic valve regurgitation is trivial by color flow Doppler.  Aorta: The aorta is normal in size and structure.  Venous: The inferior vena cava measures 0.70 cm, is normal in size with greater than 50% respiratory  variability.   09/24/15: Lexiscan stress  The left ventricular ejection fraction is hyperdynamic (>65%).  Nuclear stress EF: 68%.  There was no ST segment deviation noted during stress.  There is a small in size, mild in intensity, partially fixed defect in the mid anteroseptal area that most likely represents shifting breast attenuation artifact but cannot rule out a very small area of ischemia  This is a low risk study.  04/14/07: TTE SUMMARY - Overall left ventricular systolic function was normal. Left    ventricular ejection fraction was estimated , range being 55    %  to 65 %. There were no left ventricular regional wall    motion abnormalities. - There was mild aortic valvular regurgitation. - There was mild atheroma of the descending aorta. - There was mild mitral valvular regurgitation. - No interatrial flow by color Doppler. - There was a catheter seen in the right ventricle, without    evidence for associated vegetation or thrombus. - There was a catheter seen in the right atrium, without evidence    for associated vegetation or thrombus. IMPRESSIONS - No vegetations identified.  Recent Labs: 01/03/2019: NT-Pro BNP 1,097 01/08/2019: BUN 35; Creatinine, Ser 1.58; Potassium 4.8; Sodium 139  No results found for requested labs within last 8760 hours.   CrCl cannot be calculated (Patient's most recent lab result is older than the maximum 21 days allowed.).   Wt Readings from Last 3 Encounters:  01/09/19 143 lb (64.9 kg)  01/08/19 143 lb (64.9 kg)  12/26/18 145 lb (65.8 kg)     Other studies reviewed: Additional studies/records reviewed today include: summarized above  ASSESSMENT AND PLAN:  1. PPM     *** Intact function, no programming changes made  2. HTN     ***  3. CAD     ***No anginal complaints     She reports severe myalgias with statin, not interested in revisiting lipid meds     On a nitrate, BB  4. RLE edema      *** Not appreciated today, ortho eval scheduled for cyst noted on venous US     They will discuss her gout with him as well       5. SOB/DOE 6. HFpEF     ***  7. Falls in the last 6 months     Daughter noted a general decline in energy, not as active as she used to be, this a slow decline over about 6 mo     Unclear etiology for this     At least one fall sounded orthostatic.          Disposition: ***   Noted after patient left, she is not on an ASA with charted hx of CAD.  I will ask MA to f/u with the patient to see if she has hx of an issue/problem with ASA, if not, start ECASA 67m daily.    Current medicines are reviewed at length with the patient today.  The patient did not have any concerns regarding medicines.  SVenetia Night PA-C 02/12/2019 9:55 AM     [place] [phone](office)  [phone](fax)

## 2019-02-14 ENCOUNTER — Encounter: Payer: Medicare Other | Admitting: Physician Assistant

## 2019-02-19 NOTE — Telephone Encounter (Signed)
Noted  

## 2019-03-14 ENCOUNTER — Telehealth: Payer: Self-pay

## 2019-03-14 NOTE — Telephone Encounter (Signed)
Spoke with pt's daughter (DPR ON FILE) who is agreeable to have pt do virtual visit with Dr. Marlou Porch on 03/15/19. Pt's daughter is aware that pt's vitals need to be taken prior to appt time. Pt's daughter verbalized understanding and thanked me for the call.     Virtual Visit Pre-Appointment Phone Call  "(Name), I am calling you today to discuss your upcoming appointment. We are currently trying to limit exposure to the virus that causes COVID-19 by seeing patients at home rather than in the office."  1. "What is the BEST phone number to call the day of the visit?" - include this in appointment notes  2. "Do you have or have access to (through a family member/friend) a smartphone with video capability that we can use for your visit?" a. If yes - list this number in appt notes as "cell" (if different from BEST phone #) and list the appointment type as a VIDEO visit in appointment notes b. If no - list the appointment type as a PHONE visit in appointment notes  3. Confirm consent - "In the setting of the current Covid19 crisis, you are scheduled for a (phone or video) visit with your provider on (date) at (time).  Just as we do with many in-office visits, in order for you to participate in this visit, we must obtain consent.  If you'd like, I can send this to your mychart (if signed up) or email for you to review.  Otherwise, I can obtain your verbal consent now.  All virtual visits are billed to your insurance company just like a normal visit would be.  By agreeing to a virtual visit, we'd like you to understand that the technology does not allow for your provider to perform an examination, and thus may limit your provider's ability to fully assess your condition. If your provider identifies any concerns that need to be evaluated in person, we will make arrangements to do so.  Finally, though the technology is pretty good, we cannot assure that it will always work on either your or our end, and in the  setting of a video visit, we may have to convert it to a phone-only visit.  In either situation, we cannot ensure that we have a secure connection.  Are you willing to proceed?" STAFF: Did the patient verbally acknowledge consent to telehealth visit? Document YES/NO here: YES  4. Advise patient to be prepared - "Two hours prior to your appointment, go ahead and check your blood pressure, pulse, oxygen saturation, and your weight (if you have the equipment to check those) and write them all down. When your visit starts, your provider will ask you for this information. If you have an Apple Watch or Kardia device, please plan to have heart rate information ready on the day of your appointment. Please have a pen and paper handy nearby the day of the visit as well."  5. Give patient instructions for MyChart download to smartphone OR Doximity/[email or web address] as below if video visit (depending on what platform provider is using)  6. Inform patient they will receive a phone call 15 minutes prior to their appointment time (may be from unknown caller ID) so they should be prepared to answer    TELEPHONE CALL NOTE  Paula Walls has been deemed a candidate for a follow-up tele-health visit to limit community exposure during the Covid-19 pandemic. I spoke with the patient via phone to ensure availability of phone/video source, confirm preferred email & phone number,  and discuss instructions and expectations.  I reminded Paula Walls to be prepared with any vital sign and/or heart rhythm information that could potentially be obtained via home monitoring, at the time of her visit. I reminded Paula Walls to expect a phone call prior to her visit.  Jacinta Shoe, [place] 03/14/2019 5:06 PM   INSTRUCTIONS FOR DOWNLOADING THE MYCHART APP TO SMARTPHONE  - The patient must first make sure to have activated MyChart and know their login information - If Apple, go to [employer] and type in MyChart in the search bar and  download the app. If Android, ask patient to go to [employer] and type in [place] in the search bar and download the app. The app is free but as with any other app downloads, their phone may require them to verify saved payment information or Apple/Android password.  - The patient will need to then log into the app with their MyChart username and password, and select [facility] as their healthcare provider to link the account. When it is time for your visit, go to the MyChart app, find appointments, and click Begin Video Visit. Be sure to Select Allow for your device to access the Microphone and Camera for your visit. You will then be connected, and your provider will be with you shortly.  **If they have any issues connecting, or need assistance please contact MyChart service desk [phone])**  **If using a computer, in order to ensure the best quality for their visit they will need to use either of the following Internet Browsers: [employer], or Google Chrome**  IF USING DOXIMITY or DOXY.ME - The patient will receive a link just prior to their visit by text.     FULL LENGTH CONSENT FOR TELE-HEALTH VISIT   I hereby voluntarily request, consent and authorize [facility] and its employed or contracted physicians, physician assistants, nurse practitioners or other licensed health care professionals (the Practitioner), to provide me with telemedicine health care services (the "Services") as deemed necessary by the treating Practitioner. I acknowledge and consent to receive the Services by the Practitioner via telemedicine. I understand that the telemedicine visit will involve communicating with the Practitioner through live audiovisual communication technology and the disclosure of certain medical information by electronic transmission. I acknowledge that I have been given the opportunity to request an in-person assessment or other available alternative prior to the  telemedicine visit and am voluntarily participating in the telemedicine visit.  I understand that I have the right to withhold or withdraw my consent to the use of telemedicine in the course of my care at any time, without affecting my right to future care or treatment, and that the Practitioner or I may terminate the telemedicine visit at any time. I understand that I have the right to inspect all information obtained and/or recorded in the course of the telemedicine visit and may receive copies of available information for a reasonable fee.  I understand that some of the potential risks of receiving the Services via telemedicine include:  Marland Kitchen Delay or interruption in medical evaluation due to technological equipment failure or disruption; . Information transmitted may not be sufficient (e.g. poor resolution of images) to allow for appropriate medical decision making by the Practitioner; and/or  . In rare instances, security protocols could fail, causing a breach of personal health information.  Furthermore, I acknowledge that it is my responsibility to provide information about my medical history, conditions and care that is  complete and accurate to the best of my ability. I acknowledge that Practitioner's advice, recommendations, and/or decision may be based on factors not within their control, such as incomplete or inaccurate data provided by me or distortions of diagnostic images or specimens that may result from electronic transmissions. I understand that the practice of medicine is not an exact science and that Practitioner makes no warranties or guarantees regarding treatment outcomes. I acknowledge that I will receive a copy of this consent concurrently upon execution via email to the email address I last provided but may also request a printed copy by calling the office of [facility].    I understand that my insurance will be billed for this visit.   I have read or had this consent read to me.  . I understand the contents of this consent, which adequately explains the benefits and risks of the Services being provided via telemedicine.  . I have been provided ample opportunity to ask questions regarding this consent and the Services and have had my questions answered to my satisfaction. . I give my informed consent for the services to be provided through the use of telemedicine in my medical care  By participating in this telemedicine visit I agree to the above.

## 2019-03-15 ENCOUNTER — Encounter: Payer: Self-pay | Admitting: Cardiology

## 2019-03-15 ENCOUNTER — Telehealth (INDEPENDENT_AMBULATORY_CARE_PROVIDER_SITE_OTHER): Payer: Medicare Other | Admitting: Cardiology

## 2019-03-15 ENCOUNTER — Other Ambulatory Visit: Payer: Self-pay

## 2019-03-15 VITALS — BP 120/65 | HR 60 | Ht 60.0 in | Wt 140.0 lb

## 2019-03-15 DIAGNOSIS — I2510 Atherosclerotic heart disease of native coronary artery without angina pectoris: Secondary | ICD-10-CM

## 2019-03-15 DIAGNOSIS — I5032 Chronic diastolic (congestive) heart failure: Secondary | ICD-10-CM | POA: Diagnosis not present

## 2019-03-15 DIAGNOSIS — Z950 Presence of cardiac pacemaker: Secondary | ICD-10-CM

## 2019-03-15 DIAGNOSIS — I495 Sick sinus syndrome: Secondary | ICD-10-CM | POA: Diagnosis not present

## 2019-03-15 MED ORDER — IRBESARTAN 75 MG PO TABS: 75.0000 mg | ORAL_TABLET | Freq: Every day | ORAL | 3 refills | Status: DC

## 2019-03-15 NOTE — Progress Notes (Signed)
Virtual Visit via Video Note   This visit type was conducted due to national recommendations for restrictions regarding the COVID-19 Pandemic (e.g. social distancing) in an effort to limit this patient's exposure and mitigate transmission in our community.  Due to her co-morbid illnesses, this patient is at least at moderate risk for complications without adequate follow up.  This format is felt to be most appropriate for this patient at this time.  All issues noted in this document were discussed and addressed.  A limited physical exam was performed with this format.  Please refer to the patient's chart for her consent to telehealth for [facility].   Date:  03/15/2019   ID:  Paula Walls, DOB 30-Oct-1931, MRN [id number]  Patient Location: Home Provider Location: Home  PCP:  Dione Housekeeper, MD  Cardiologist:  No primary care provider on file. Paula Walls New Electrophysiologist:  Virl Axe, MD   Evaluation Performed:  New Patient Evaluation  Chief Complaint: Diastolic heart failure, hypertension, orthostatic hypotension  History of Present Illness:    Paula Walls is a 85 y.o. female with coronary artery disease showing a high-grade LAD lesion in 2005 at diagonal bifurcation with catheterization in 2009 demonstrating moderate diffuse disease with normal ejection fraction, hypertension hypothyroidism sinus node dysfunction with pacemaker followed by Dr. Caryl Comes here for the evaluation of cardiomyopathy.  Back in February 2019 she had fatigue and vertigo went to the emergency room and was diagnosed with URI and gout.  She had had recurrent falls edema and shortness of breath after being seen by Gilford Rile, NP.  This was in July 2020.  She fell after getting up too fast she thinks.  She had fallen about 3 times over 6 months.  Enjoys her books and puzzles.  She has been reporting shortness of breath for quite some time but it seems of gotten worse.  She did receive an echocardiogram that  showed EF of greater than 65% with mild LVH and impaired relaxation.  Lasix was utilized.  Right lower extremity was negative for DVT.  Paula Walls daughter.  Overall today she feels as though she is doing well.  No real complaints.  No fever chills nausea vomiting syncope bleeding.  The patient does not have symptoms concerning for COVID-19 infection (fever, chills, cough, or new shortness of breath).    Past Medical History:  Diagnosis Date   Chronotropic incompetence    Coronary artery disease    Diverticulosis    Hyperlipidemia    Hypertension    Hypothyroidism    Iron deficiency anemia    MRSA (methicillin resistant Staphylococcus aureus)    Sinoatrial node dysfunction (HCC)    UC (ulcerative colitis) ([place])    Past Surgical History:  Procedure Laterality Date   CARDIAC CATHETERIZATION  02/04/2004   EP IMPLANTABLE DEVICE N/A 01/28/2016   Procedure: PPM Generator Changeout;  Surgeon: Deboraha Sprang, MD;  Location: [place] [facility];  Service: Cardiovascular;  Laterality: N/A;   PACEMAKER INSERTION  02/06/2004   St. Jude   THYROIDECTOMY, PARTIAL       Current Meds  Medication Sig   amLODipine (NORVASC) 2.5 MG tablet Take 1 tablet (2.5 mg total) by mouth daily.   colchicine 0.6 MG tablet Take 0.6 mg by mouth as needed (when have gout).   furosemide (LASIX) 20 MG tablet Take 1 tablet (20 mg total) by mouth every other day. Take one tablet (20 mg ) by mouth every other day   irbesartan (AVAPRO) 75 MG  tablet Take 1 tablet (75 mg total) by mouth daily.   isosorbide mononitrate (IMDUR) 60 MG 24 hr tablet TAKE 1 TABLET BY MOUTH EVERY DAY   levothyroxine (SYNTHROID, LEVOTHROID) 100 MCG tablet Take 1 tablet (100 mcg total) daily by mouth.   metoprolol succinate (TOPROL-XL) 50 MG 24 hr tablet TAKE 1/2 TABLET BY MOUTH DAILY WITH OR IMMEDIATELY FOLLOWING A MEAL.   [DISCONTINUED] irbesartan (AVAPRO) 150 MG tablet TAKE 1 TABLET BY MOUTH EVERY DAY     Allergies:   Flagyl  [metronidazole hcl]   Social History   Tobacco Use   Smoking status: Never Smoker   Smokeless tobacco: Never Used  Substance Use Topics   Alcohol use: No   Drug use: No     Family Hx: The patient's family history includes Arthritis in her daughter; Fibromyalgia in her daughter; Heart attack in her father; Stroke in her mother; Thyroid disease in her mother.  ROS:   Please see the history of present illness.     All other systems reviewed and are negative.   Prior CV studies:   The following studies were reviewed today:   01/03/2019 TTE IMPRESSIONS 1. The left ventricle has hyperdynamic systolic function, with an ejection fraction of >65%. The cavity size was normal. There is mildly increased left ventricular wall thickness. Left ventricular diastolic Doppler parameters are consistent with  impaired relaxation. Elevated left atrial and left ventricular end-diastolic pressures The E/e' is >15. No evidence of left ventricular regional wall motion abnormalities. 2. The right ventricle has normal systolic function. The cavity was normal. There is no increase in right ventricular wall thickness. 3. Left atrial size was mildly dilated. 4. The mitral valve is abnormal. Mild thickening of the mitral valve leaflet. 5. The tricuspid valve is grossly normal. 6. The aortic valve is tricuspid. Mild sclerosis of the aortic valve. Aortic valve regurgitation is trivial by color flow Doppler. No stenosis of the aortic valve. 7. The aorta is normal in size and structure.  SUMMARY  LVEF >65%, mild LVH, normal wall motion, grade 1 DD, elevated LV filling pressure, mild LAE, aortic valve sclerosis with trivial AI, trivial MR, mild TR, RVSP 27 mmHg, normal IVC FINDINGS Left Ventricle: The left ventricle has hyperdynamic systolic function, with an ejection fraction of >65%. The cavity size was normal. There is mildly increased left ventricular wall thickness. Left ventricular  diastolic Doppler parameters are consistent with impaired relaxation. Elevated left atrial and left ventricular end-diastolic pressures The E/e' is >15. No evidence of left ventricular regional wall motion abnormalities..  Right Ventricle: The right ventricle has normal systolic function. The cavity was normal. There is no increase in right ventricular wall thickness.  Left Atrium: Left atrial size was mildly dilated.  Right Atrium: Right atrial size was normal in size. Right atrial pressure is estimated at 10 mmHg.  Interatrial Septum: No atrial level shunt detected by color flow Doppler.  Pericardium: There is no evidence of pericardial effusion.  Mitral Valve: The mitral valve is abnormal. Mild thickening of the mitral valve leaflet. Mitral valve regurgitation is trivial by color flow Doppler.  Tricuspid Valve: The tricuspid valve is grossly normal. Tricuspid valve regurgitation is mild by color flow Doppler.  Aortic Valve: The aortic valve is tricuspid Mild sclerosis of the aortic valve. Aortic valve regurgitation is trivial by color flow Doppler. There is No stenosis of the aortic valve.  Pulmonic Valve: The pulmonic valve was grossly normal. Pulmonic valve regurgitation is trivial by color flow Doppler.  Aorta: The aorta is normal in size and structure.  Venous: The inferior vena cava measures 0.70 cm, is normal in size with greater than 50% respiratory variability.   09/24/15: Lexiscan stress  The left ventricular ejection fraction is hyperdynamic (>65%).  Nuclear stress EF: 68%.  There was no ST segment deviation noted during stress.  There is a small in size, mild in intensity, partially fixed defect in the mid anteroseptal area that most likely represents shifting breast attenuation artifact but cannot rule out a very small area of ischemia  This is a low risk study.  Labs/Other Tests and Data Reviewed:    EKG:  Atrial paced left anterior fascicular  block on 12/26/2018  Recent Labs: 01/03/2019: NT-Pro BNP 1,097 01/08/2019: BUN 35; Creatinine, Ser 1.58; Potassium 4.8; Sodium 139   Recent Lipid Panel No results found for: CHOL, TRIG, HDL, CHOLHDL, LDLCALC, LDLDIRECT  Wt Readings from Last 3 Encounters:  03/15/19 140 lb (63.5 kg)  01/09/19 143 lb (64.9 kg)  01/08/19 143 lb (64.9 kg)     Objective:    Vital Signs:  BP 120/65    Pulse 60    Ht 5' (1.524 m)    Wt 140 lb (63.5 kg)    BMI 27.34 kg/m    VITAL SIGNS:  reviewed GEN:  no acute distress EYES:  sclerae anicteric, EOMI - Extraocular Movements Intact RESPIRATORY:  normal respiratory effort, symmetric expansion SKIN:  no rash, lesions or ulcers. MUSCULOSKELETAL:  no obvious deformities. NEURO:  alert and oriented x 3, no obvious focal deficit PSYCH:  normal affect  ASSESSMENT & PLAN:    Chronic diastolic heart failure - BNP remains elevated- no evidence of significant fluid overload.  Right lower extremity edema - No evidence of DVT.  Increased falls decreased energy -Watch out for any signs of orthostasis.  In fact, I would like for her to have a little bit more margin with her blood pressure and we will decrease her Avapro to 75 mg from 150.  We will follow-up with a virtual visit in 1 month to see how she is doing with this medication change.  She was very hesitant to decrease or stop the amlodipine previously.  She remembers or she we told the story how if she stops this medication she may need to go to the emergency room with high blood pressure.  She seemed to be more receptive to decreasing her Avapro.  Her daughter Paula Walls was there for the conversation.  I also encouraged more walking, exercise to help with her overall weakness.  Essential hypertension - There was some communication error with never stopping her amlodipine as advised.  She was fearful of stopping the amlodipine  Pacemaker -Working well, Dr. Caryl Comes, atrial pacing  Coronary artery disease - No  anginal symptoms.  Does not take statins because of severe myalgias.  She is on a nitrate and beta-blocker.  Statin intolerance -Not interested    COVID-19 Education: The signs and symptoms of COVID-19 were discussed with the patient and how to seek care for testing (follow up with PCP or arrange E-visit).  The importance of social distancing was discussed today.  Time:   Today, I have spent 21 minutes with the patient with telehealth technology discussing the above problems and chart review.     Medication Adjustments/Labs and Tests Ordered: Current medicines are reviewed at length with the patient today.  Concerns regarding medicines are outlined above.   Tests Ordered: No orders of the defined types were  placed in this encounter.   Medication Changes: Meds ordered this encounter  Medications   irbesartan (AVAPRO) 75 MG tablet    Sig: Take 1 tablet (75 mg total) by mouth daily.    Dispense:  90 tablet    Refill:  3    Please discontinue RX for 150 mg tablets.  Thank you    Follow Up:  Virtual Visit in 1 month(s) Derald Macleod, Candee Furbish, MD  03/15/2019 5:13 PM    [facility]

## 2019-03-15 NOTE — Patient Instructions (Signed)
Medication Instructions:  Please stop taking Irbesartan 150 mg daily and start Irbesartan 75 mg once a day.  A new prescription has been sent into your pharmacy for this. Continue all other medications as listed.  If you need a refill on your cardiac medications before your next appointment, please call your pharmacy.   Follow-Up: Please follow up in 1 month with a virtual appointment with Kathyrn Drown, NP.  Thank you for choosing [facility]!!

## 2019-04-02 ENCOUNTER — Telehealth: Payer: Self-pay | Admitting: *Deleted

## 2019-04-02 MED ORDER — IRBESARTAN 150 MG PO TABS: 150.0000 mg | ORAL_TABLET | Freq: Every day | ORAL | 3 refills | Status: DC

## 2019-04-02 NOTE — Telephone Encounter (Signed)
I am fine going back to the irbesartan 150 mg once a day.  Reviewed message below.  Please send prescription.  Thank you.  Candee Furbish, MD

## 2019-04-02 NOTE — Telephone Encounter (Signed)
Jerline Pain, MD 14 minutes ago (10:27 AM)     I am fine going back to the irbesartan 150 mg once a day.  Reviewed message below.  Please send prescription.  Thank you.  Candee Furbish, MD       Documentation    Message sent thru MyChart to patient - new rx for 150 mg send electronically to pharmacy.

## 2019-04-03 ENCOUNTER — Ambulatory Visit (INDEPENDENT_AMBULATORY_CARE_PROVIDER_SITE_OTHER): Payer: Medicare Other | Admitting: *Deleted

## 2019-04-03 DIAGNOSIS — I495 Sick sinus syndrome: Secondary | ICD-10-CM

## 2019-04-03 LAB — CUP PACEART REMOTE DEVICE CHECK
Battery Remaining Longevity: 118 mo
Battery Remaining Percentage: 95.5 %
Battery Voltage: 3.01 V
Brady Statistic AP VP Percent: 1 %
Brady Statistic AP VS Percent: 99 %
Brady Statistic AS VP Percent: 1 %
Brady Statistic AS VS Percent: 1 %
Brady Statistic RA Percent Paced: 99 %
Brady Statistic RV Percent Paced: 1 %
Date Time Interrogation Session: 20201026060014
Implantable Lead Implant Date: 20050901
Implantable Lead Implant Date: 20050901
Implantable Lead Location: 753859
Implantable Lead Location: 753860
Implantable Pulse Generator Implant Date: 20170823
Lead Channel Impedance Value: 550 Ohm
Lead Channel Impedance Value: 560 Ohm
Lead Channel Pacing Threshold Amplitude: 0.5 V
Lead Channel Pacing Threshold Amplitude: 0.75 V
Lead Channel Pacing Threshold Pulse Width: 0.5 ms
Lead Channel Pacing Threshold Pulse Width: 0.5 ms
Lead Channel Sensing Intrinsic Amplitude: 12 mV
Lead Channel Sensing Intrinsic Amplitude: 2.1 mV
Lead Channel Setting Pacing Amplitude: 2 V
Lead Channel Setting Pacing Amplitude: 2.5 V
Lead Channel Setting Pacing Pulse Width: 0.5 ms
Lead Channel Setting Sensing Sensitivity: 2 mV
Pulse Gen Model: 2272
Pulse Gen Serial Number: 7942041

## 2019-04-18 ENCOUNTER — Telehealth: Payer: Medicare Other | Admitting: Cardiology

## 2019-04-22 IMAGING — CT CT HEAD W/O CM
3 series · 16 of 47 positions shown, 19 images · non-contrast
Comparison: None

CLINICAL DATA: Dizziness and elevated blood pressure, not feeling
RIGHT for 2 days, increased fatigue, history coronary artery
disease, hypertension

EXAM:
CT HEAD WITHOUT CONTRAST
TECHNIQUE: Contiguous axial images were obtained from the base of the skull
through the vertex without intravenous contrast. Sagittal and
coronal MPR images reconstructed from axial data set.

[Series 3: head 5.0 h30s · axial · 0.40mm/px · z∈[-93,+42]mm · 10 of 33 slices shown, 13 images]
[im 3/33  brain]
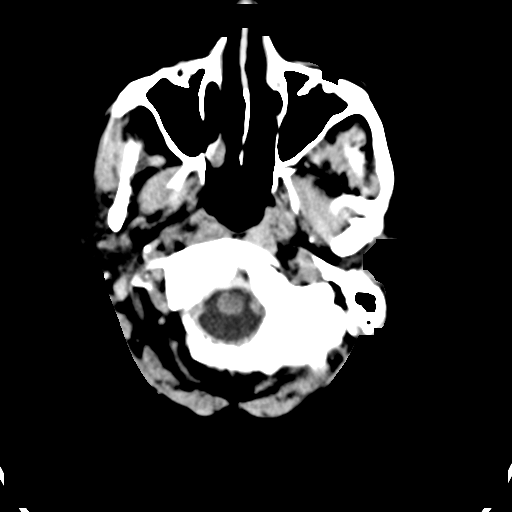
[im 3/33  bone]
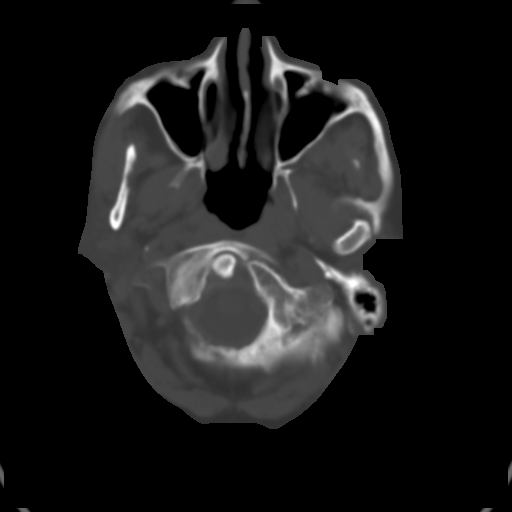
[im 6/33  brain]
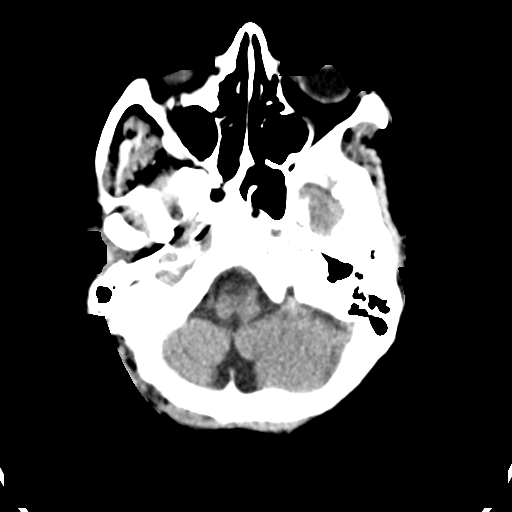
[im 9/33  brain]
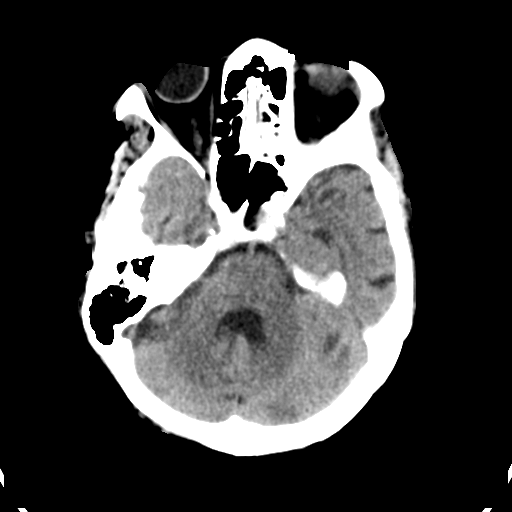
[im 12/33  brain]
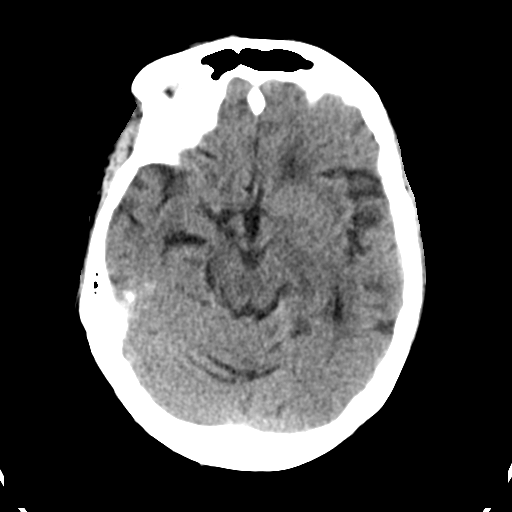
[im 15/33  brain]
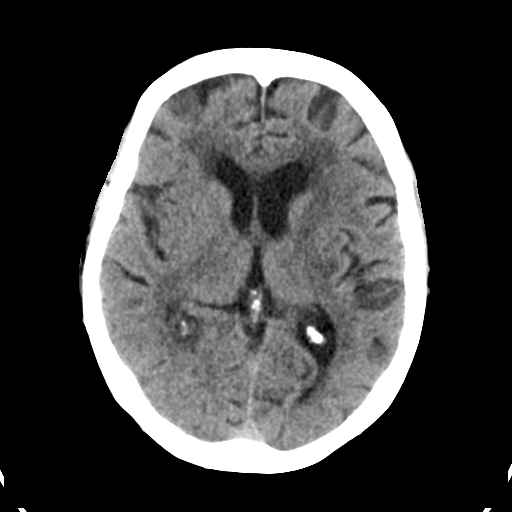
[im 15/33  bone]
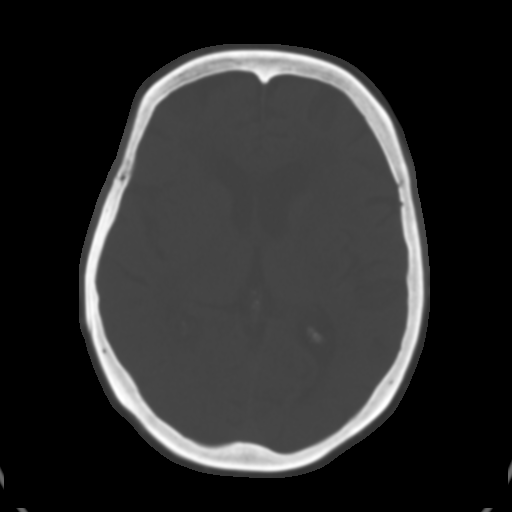
[im 18/33  brain]
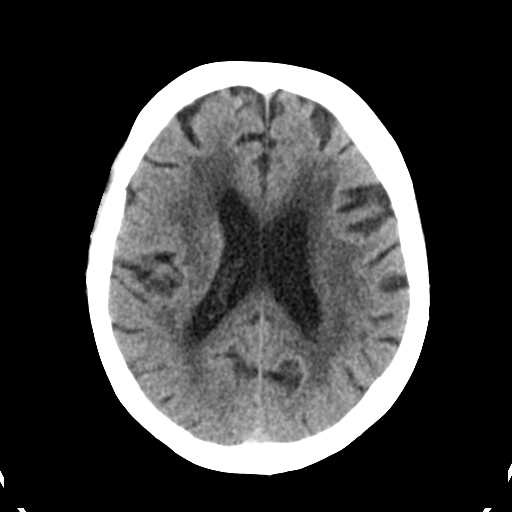
[im 21/33  brain]
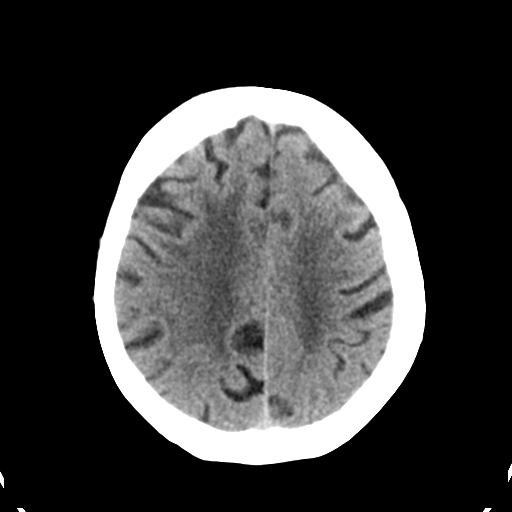
[im 25/33  brain]
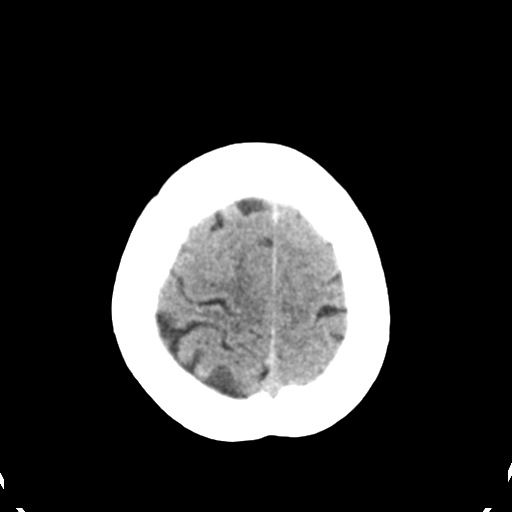
[im 27/33  brain]
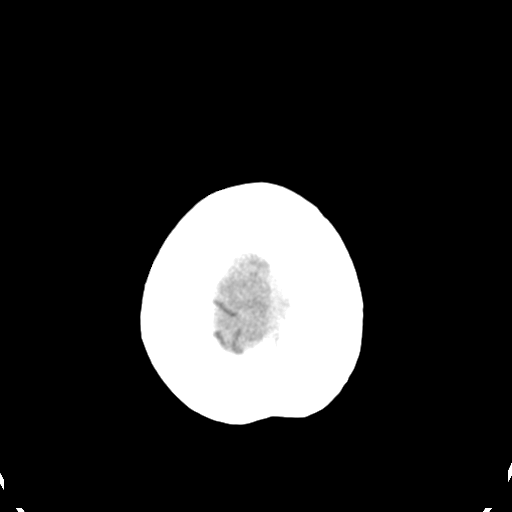
[im 27/33  bone]
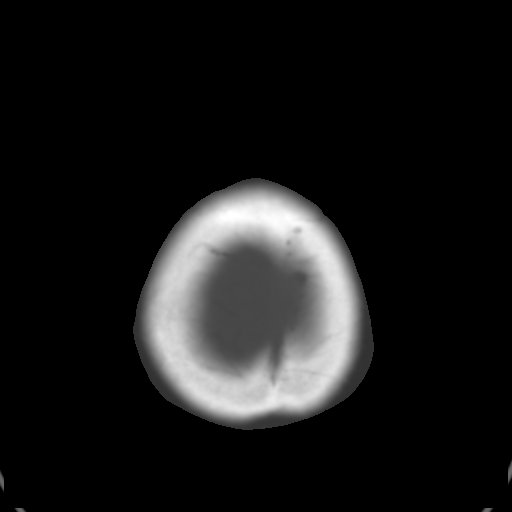
[im 30/33  brain]
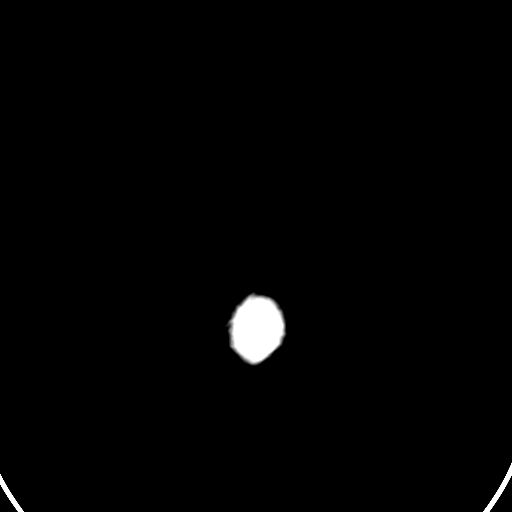

[Series 5: head 3.0 mpr cor · coronal · 0.32mm/px · 3 of 65 slices shown]
[im 22/65  brain]
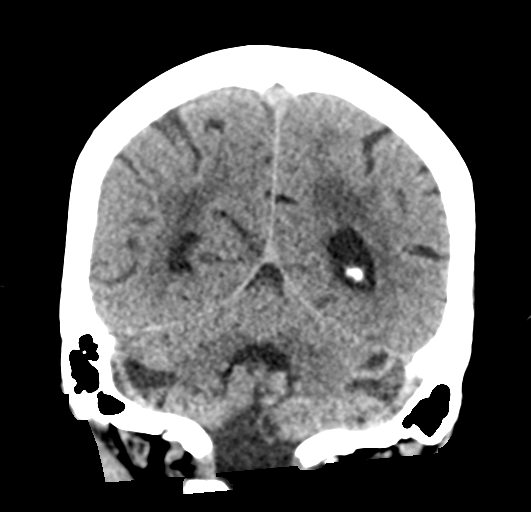
[im 29/65  brain]
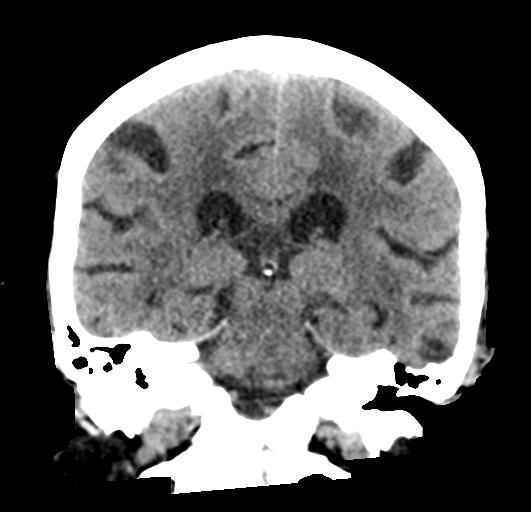
[im 36/65  brain]
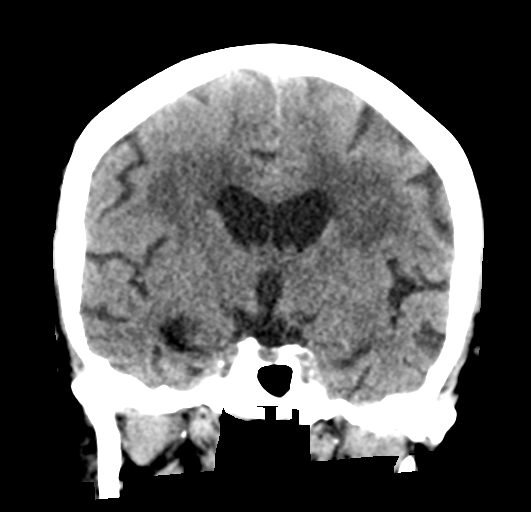

[Series 6: head 3.0 mpr sag · sagittal · 0.32mm/px · 3 of 55 slices shown]
[im 19/55  brain]
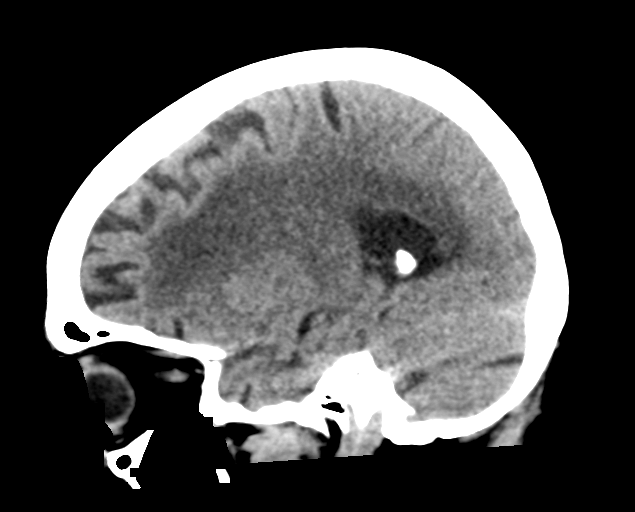
[im 28/55  brain]
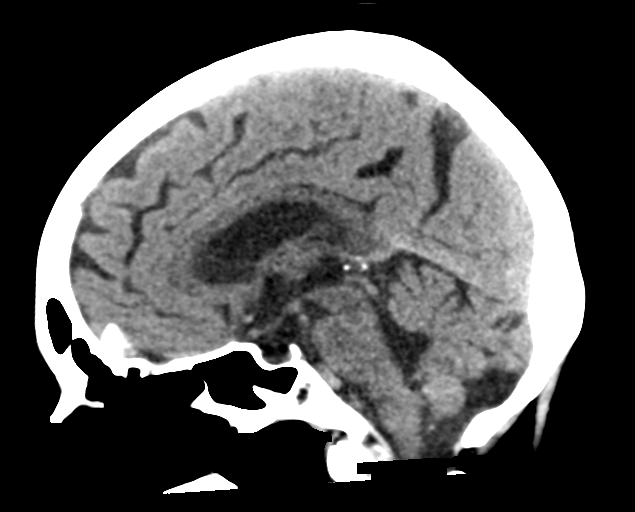
[im 37/55  brain]
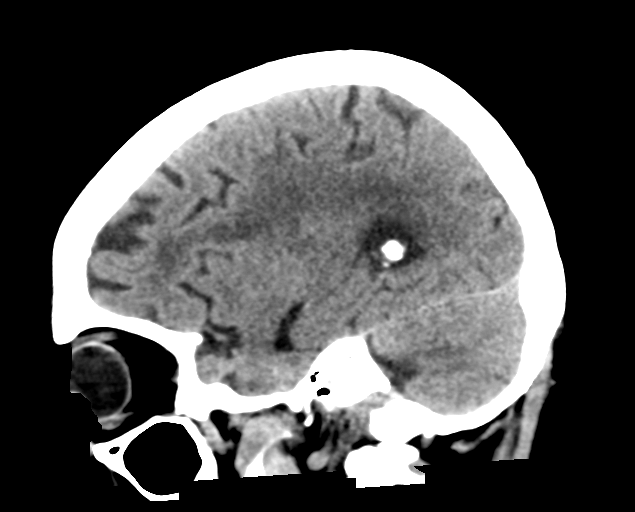

[16 of 47 positions shown; findings below may reference images not displayed]

FINDINGS: Brain: Generalized atrophy. Normal ventricular morphology. No
midline shift or mass effect. Small vessel chronic ischemic changes
of deep cerebral white matter. No intracranial hemorrhage, mass
lesion or definite evidence of acute infarction. No extra-axial
fluid collections.

Vascular: Atherosclerotic calcifications of internal carotid
arteries at skull base

Skull: Intact

Sinuses/Orbits: Clear

Other: N/A
IMPRESSION: Atrophy with small vessel chronic ischemic changes of deep cerebral
white matter.

No acute intracranial abnormalities.

## 2019-04-24 NOTE — Progress Notes (Signed)
Remote pacemaker transmission.   

## 2019-07-03 ENCOUNTER — Ambulatory Visit (INDEPENDENT_AMBULATORY_CARE_PROVIDER_SITE_OTHER): Payer: Medicare Other | Admitting: *Deleted

## 2019-07-03 DIAGNOSIS — I495 Sick sinus syndrome: Secondary | ICD-10-CM

## 2019-07-03 LAB — CUP PACEART REMOTE DEVICE CHECK
Battery Remaining Longevity: 116 mo
Battery Remaining Percentage: 95.5 %
Battery Voltage: 2.99 V
Brady Statistic AP VP Percent: 1 %
Brady Statistic AP VS Percent: 99 %
Brady Statistic AS VP Percent: 1 %
Brady Statistic AS VS Percent: 1.1 %
Brady Statistic RA Percent Paced: 99 %
Brady Statistic RV Percent Paced: 1 %
Date Time Interrogation Session: 20210125020014
Implantable Lead Implant Date: 20050901
Implantable Lead Implant Date: 20050901
Implantable Lead Location: 753859
Implantable Lead Location: 753860
Implantable Pulse Generator Implant Date: 20170823
Lead Channel Impedance Value: 510 Ohm
Lead Channel Impedance Value: 530 Ohm
Lead Channel Pacing Threshold Amplitude: 0.5 V
Lead Channel Pacing Threshold Amplitude: 0.75 V
Lead Channel Pacing Threshold Pulse Width: 0.5 ms
Lead Channel Pacing Threshold Pulse Width: 0.5 ms
Lead Channel Sensing Intrinsic Amplitude: 1.3 mV
Lead Channel Sensing Intrinsic Amplitude: 12 mV
Lead Channel Setting Pacing Amplitude: 2 V
Lead Channel Setting Pacing Amplitude: 2.5 V
Lead Channel Setting Pacing Pulse Width: 0.5 ms
Lead Channel Setting Sensing Sensitivity: 2 mV
Pulse Gen Model: 2272
Pulse Gen Serial Number: 7942041

## 2019-08-21 ENCOUNTER — Other Ambulatory Visit: Payer: Self-pay | Admitting: Internal Medicine

## 2019-10-02 ENCOUNTER — Ambulatory Visit (INDEPENDENT_AMBULATORY_CARE_PROVIDER_SITE_OTHER): Payer: Medicare Other | Admitting: *Deleted

## 2019-10-02 DIAGNOSIS — I495 Sick sinus syndrome: Secondary | ICD-10-CM

## 2019-10-02 LAB — CUP PACEART REMOTE DEVICE CHECK
Battery Remaining Longevity: 115 mo
Battery Remaining Percentage: 95.5 %
Battery Voltage: 2.99 V
Brady Statistic AP VP Percent: 1 %
Brady Statistic AP VS Percent: 99 %
Brady Statistic AS VP Percent: 1 %
Brady Statistic AS VS Percent: 1 %
Brady Statistic RA Percent Paced: 99 %
Brady Statistic RV Percent Paced: 1 %
Date Time Interrogation Session: 20210427020014
Implantable Lead Implant Date: 20050901
Implantable Lead Implant Date: 20050901
Implantable Lead Location: 753859
Implantable Lead Location: 753860
Implantable Pulse Generator Implant Date: 20170823
Lead Channel Impedance Value: 490 Ohm
Lead Channel Impedance Value: 530 Ohm
Lead Channel Pacing Threshold Amplitude: 0.5 V
Lead Channel Pacing Threshold Amplitude: 0.75 V
Lead Channel Pacing Threshold Pulse Width: 0.5 ms
Lead Channel Pacing Threshold Pulse Width: 0.5 ms
Lead Channel Sensing Intrinsic Amplitude: 1.3 mV
Lead Channel Sensing Intrinsic Amplitude: 12 mV
Lead Channel Setting Pacing Amplitude: 2 V
Lead Channel Setting Pacing Amplitude: 2.5 V
Lead Channel Setting Pacing Pulse Width: 0.5 ms
Lead Channel Setting Sensing Sensitivity: 2 mV
Pulse Gen Model: 2272
Pulse Gen Serial Number: 7942041

## 2019-10-03 NOTE — Progress Notes (Signed)
PPM Remote  

## 2019-11-26 ENCOUNTER — Telehealth: Payer: Self-pay | Admitting: Emergency Medicine

## 2019-11-26 NOTE — Telephone Encounter (Signed)
Symptoms started couple weeks ago.  Pain in center of chest, spreads outward. Little episodes come and go.  Can come at rest.  Today had one episode and it was after running errands. Coming out of [employer] office, got into car and felt little dizzy. Sat still until it passed in a few minutes,  gets a lot of water or saliva in throat and has to spit it out.  Gets weak.   Pt at first thought was muscular. Does not feel like heartburn or indigestion.  No nausea. BPs on phone  sitting 194/75 60, (but had just went upstairs to get her cuff) on recheck 170/81  No swelling or new SOB.   Spoke with Dr. Olin Pia nurse and added patient to open spot tomorrow w Dr. Caryl Comes. Pt and daughter are appreciative for assistance.

## 2019-11-26 NOTE — Telephone Encounter (Signed)
Pt c/o of Chest Pain: STAT if CP now or developed within 24 hours  1. Are you having CP right now? Not right now.   2. Are you experiencing any other symptoms (ex. SOB, nausea, vomiting, sweating)?  Clear liquid coming up but not vomiting. SOB on exertion and having dizziness yesterday.   3. How long have you been experiencing CP? A few weeks.   4. Is your CP continuous or coming and going? Coming and going, more frequent episodes over the last few days.  5. Have you taken Nitroglycerin? Not recently.  ?

## 2019-11-27 ENCOUNTER — Encounter: Payer: Self-pay | Admitting: Internal Medicine

## 2019-11-27 ENCOUNTER — Other Ambulatory Visit: Payer: Self-pay

## 2019-11-27 ENCOUNTER — Ambulatory Visit (INDEPENDENT_AMBULATORY_CARE_PROVIDER_SITE_OTHER): Payer: Medicare Other | Admitting: Internal Medicine

## 2019-11-27 VITALS — BP 134/76 | HR 63 | Ht 60.0 in | Wt 140.3 lb

## 2019-11-27 DIAGNOSIS — I5032 Chronic diastolic (congestive) heart failure: Secondary | ICD-10-CM | POA: Diagnosis not present

## 2019-11-27 DIAGNOSIS — Z01812 Encounter for preprocedural laboratory examination: Secondary | ICD-10-CM

## 2019-11-27 DIAGNOSIS — N289 Disorder of kidney and ureter, unspecified: Secondary | ICD-10-CM

## 2019-11-27 DIAGNOSIS — I495 Sick sinus syndrome: Secondary | ICD-10-CM | POA: Diagnosis not present

## 2019-11-27 DIAGNOSIS — Z950 Presence of cardiac pacemaker: Secondary | ICD-10-CM | POA: Diagnosis not present

## 2019-11-27 MED ORDER — ASPIRIN EC 81 MG PO TBEC: 81.0000 mg | DELAYED_RELEASE_TABLET | Freq: Every day | ORAL | 3 refills | Status: AC

## 2019-11-27 MED ORDER — IRBESARTAN 150 MG PO TABS: 75.0000 mg | ORAL_TABLET | Freq: Every day | ORAL | 3 refills | Status: DC

## 2019-11-27 MED ORDER — OMEPRAZOLE 20 MG PO CPDR: 20.0000 mg | DELAYED_RELEASE_CAPSULE | Freq: Every day | ORAL | 11 refills | Status: AC

## 2019-11-27 MED ORDER — ISOSORBIDE MONONITRATE ER 30 MG PO TB24: 30.0000 mg | ORAL_TABLET | Freq: Every day | ORAL | 3 refills | Status: DC

## 2019-11-27 NOTE — Progress Notes (Signed)
Patient Care Team: Reed Breech, NP as PCP - General (Nurse Practitioner) Deboraha Sprang, MD as PCP - Electrophysiology (Cardiology)   HPI  Paula Walls is a 86 y.o. female is seen following pacemaker implantation for sinus node dysfunction. She underwent generator replacement 2017  She has no intrinsic atrial rhythm.  She has normal left ventricular function; by echo in 2008 and catheterization 2009 demonstrated moderate diffuse disease also with normal ejection fraction.  Catheterization 2005 described high grade LAD diagonal bifurcation disease with a large area of distribution subserved by a small diagonal.  Medical therapy was recommended by Dr. Lilly Cove again at repeat catheterization   She comes in now with complaints of over the last month or 2 episodes of chest discomfort described as heaviness radiating across her chest but not into her neck back or arms.  Associated with some shortness of breath but no diaphoresis relieved by rest.    triggered with exertion but also with meals and recumbency  More recently associated with spitting up of clear liquid.   DATE TEST     4*/17 Myoview   EF 65 % No ischemia  Hyperdynamic function   Date Cr K Hgb  2/19 1.35 4.7 12.3  8/20  1.58 4.8     a    Past Medical History:  Diagnosis Date  . Chronotropic incompetence   . Coronary artery disease   . Diverticulosis   . Hyperlipidemia   . Hypertension   . Hypothyroidism   . Iron deficiency anemia   . MRSA (methicillin resistant Staphylococcus aureus)   . Sinoatrial node dysfunction (HCC)   . UC (ulcerative colitis) [facility])     Past Surgical History:  Procedure Laterality Date  . CARDIAC CATHETERIZATION  02/04/2004  . EP IMPLANTABLE DEVICE N/A 01/28/2016   Procedure: PPM Generator Changeout;  Surgeon: Deboraha Sprang, MD;  Location: [place] [facility];  Service: Cardiovascular;  Laterality: N/A;  . PACEMAKER INSERTION  02/06/2004   St. Jude  . THYROIDECTOMY, PARTIAL       Current Outpatient Medications  Medication Sig Dispense Refill  . amLODipine (NORVASC) 2.5 MG tablet TAKE 1 TABLET BY MOUTH EVERY DAY 90 tablet 1  . colchicine 0.6 MG tablet Take 0.6 mg by mouth as needed (when have gout).    . furosemide (LASIX) 20 MG tablet Take 1 tablet (20 mg total) by mouth every other day. Take one tablet (20 mg ) by mouth every other day 45 tablet 3  . irbesartan (AVAPRO) 150 MG tablet Take 1 tablet (150 mg total) by mouth daily. 90 tablet 3  . isosorbide mononitrate (IMDUR) 60 MG 24 hr tablet TAKE 1 TABLET BY MOUTH EVERY DAY 90 tablet 3  . levothyroxine (SYNTHROID, LEVOTHROID) 100 MCG tablet Take 1 tablet (100 mcg total) daily by mouth. 90 tablet 3  . metoprolol succinate (TOPROL-XL) 50 MG 24 hr tablet TAKE 1/2 TABLET BY MOUTH DAILY WITH OR IMMEDIATELY FOLLOWING A MEAL. 45 tablet 3   No current facility-administered medications for this visit.    Allergies  Allergen Reactions  . Flagyl [Metronidazole Hcl] Other (See Comments)    Extreme itch in head. Resembles lice    Review of Systems negative except from HPI and PMH  Physical Exam BP 134/76   Pulse 63   Ht 5' (1.524 m)   Wt 140 lb 4.8 oz (63.6 kg)   SpO2 95%   BMI 27.40 kg/m  Well developed and well nourished in  no acute distress HENT normal Neck supple with JVP-flat Clear Device pocket well healed; without hematoma or erythema.  There is no tethering  Regular rate and rhythm, no  murmur Abd-soft with active BS No Clubbing cyanosis   edema Skin-warm and dry A & Oriented  Grossly normal sensory and motor function  ECG atrial pacing at 63 Interval 21/11/40 Axis left -60  Assessment and  Plan  Sinus node dysfunction/ chronotropic incompetence    Pacemaker-[employer] Jude dual-chamber  Hypertension    Dypsnea on Exertion   Chest discomfort  Coronary disease diffuse=--non obstructive   HFpEF   Renal Insufficiency  Class 4 Cr 1.7.  With GFR 25 or so     Euvolemic.  Device function  normal.  Blood pressure well controlled.  Concerned about the chest discomfort.  She has known obstructive coronary disease albeit not involving the major vessels at remote catheterization.  Her renal function may make catheterization versus CTA challenging; hence, we will undertake stress testing.  We will add aspirin.  Also increase Imdur at 60--90 and decrease Avapro 150--75.  If Myoview were high risk, would consider catheterization.

## 2019-11-27 NOTE — Patient Instructions (Addendum)
Medication Instructions:  Your physician has recommended you make the following change in your medication:   ** Decrease Irbesartan (AVAPRO) to 70m (1/2 tablet) daily.  ** Start Aspirin 856mdaily  ** Begin OTC Prilosec  ** Increase Imdur to 9099maily - You will take 1 -  71m35mblet by mouth and 1 -  30mg19mlet by mouth daily.   Labwork: CBC and BMET  Testing/Procedures: None ordered today.    Follow-Up: Your physician wants you to follow-up: to be determined. You will receive a reminder letter in the mail two months in advance. If you don't receive a letter, please call our office to schedule the follow-up appointment.  Remote monitoring is used to monitor your Pacemaker of ICD from home. This monitoring reduces the number of office visits required to check your device to one time per year. It allows us to[place]ore[place]ep an eye on the functioning of your device to ensure it is working properly.  Any Other Special Instructions Will Be Listed Below (If Applicable).  If you need a refill on your cardiac medications before your next appointment, please call your pharmacy.

## 2019-11-28 LAB — CBC
Hematocrit: 37 % (ref 34.0–46.6)
Hemoglobin: 12.4 g/dL (ref 11.1–15.9)
MCH: 28.8 pg (ref 26.6–33.0)
MCHC: 33.5 g/dL (ref 31.5–35.7)
MCV: 86 fL (ref 79–97)
Platelets: 235 10*3/uL (ref 150–450)
RBC: 4.31 x10E6/uL (ref 3.77–5.28)
RDW: 14.6 % (ref 11.7–15.4)
WBC: 8.1 10*3/uL (ref 3.4–10.8)

## 2019-11-28 LAB — BASIC METABOLIC PANEL
BUN/Creatinine Ratio: 21 (ref 12–28)
BUN: 33 mg/dL — ABNORMAL HIGH (ref 8–27)
CO2: 21 mmol/L (ref 20–29)
Calcium: 9.1 mg/dL (ref 8.7–10.3)
Chloride: 103 mmol/L (ref 96–106)
Creatinine, Ser: 1.58 mg/dL — ABNORMAL HIGH (ref 0.57–1.00)
GFR calc Af Amer: 34 mL/min/{1.73_m2} — ABNORMAL LOW (ref >59)
GFR calc non Af Amer: 30 mL/min/{1.73_m2} — ABNORMAL LOW (ref >59)
Glucose: 88 mg/dL (ref 65–99)
Potassium: 4.8 mmol/L (ref 3.5–5.2)
Sodium: 136 mmol/L (ref 134–144)

## 2020-01-01 ENCOUNTER — Ambulatory Visit (INDEPENDENT_AMBULATORY_CARE_PROVIDER_SITE_OTHER): Payer: Medicare Other | Admitting: *Deleted

## 2020-01-01 DIAGNOSIS — I495 Sick sinus syndrome: Secondary | ICD-10-CM

## 2020-01-02 LAB — CUP PACEART REMOTE DEVICE CHECK
Battery Remaining Longevity: 115 mo
Battery Remaining Percentage: 95.5 %
Battery Voltage: 2.99 V
Brady Statistic AP VP Percent: 1 %
Brady Statistic AP VS Percent: 99 %
Brady Statistic AS VP Percent: 1 %
Brady Statistic AS VS Percent: 1 %
Brady Statistic RA Percent Paced: 99 %
Brady Statistic RV Percent Paced: 1 %
Date Time Interrogation Session: 20210727020015
Implantable Lead Implant Date: 20050901
Implantable Lead Implant Date: 20050901
Implantable Lead Location: 753859
Implantable Lead Location: 753860
Implantable Pulse Generator Implant Date: 20170823
Lead Channel Impedance Value: 490 Ohm
Lead Channel Impedance Value: 510 Ohm
Lead Channel Pacing Threshold Amplitude: 0.5 V
Lead Channel Pacing Threshold Amplitude: 1.25 V
Lead Channel Pacing Threshold Pulse Width: 0.5 ms
Lead Channel Pacing Threshold Pulse Width: 0.5 ms
Lead Channel Sensing Intrinsic Amplitude: 12 mV
Lead Channel Sensing Intrinsic Amplitude: 2.6 mV
Lead Channel Setting Pacing Amplitude: 2 V
Lead Channel Setting Pacing Amplitude: 2.5 V
Lead Channel Setting Pacing Pulse Width: 0.5 ms
Lead Channel Setting Sensing Sensitivity: 2 mV
Pulse Gen Model: 2272
Pulse Gen Serial Number: 7942041

## 2020-01-04 NOTE — Progress Notes (Signed)
Remote pacemaker transmission.   

## 2020-01-21 ENCOUNTER — Other Ambulatory Visit: Payer: Self-pay

## 2020-01-21 MED ORDER — ISOSORBIDE MONONITRATE ER 30 MG PO TB24: 30.0000 mg | ORAL_TABLET | Freq: Every day | ORAL | 3 refills | Status: AC

## 2020-01-21 MED ORDER — ISOSORBIDE MONONITRATE ER 60 MG PO TB24: 60.0000 mg | ORAL_TABLET | Freq: Every day | ORAL | 3 refills | Status: AC

## 2020-01-24 ENCOUNTER — Other Ambulatory Visit: Payer: Self-pay

## 2020-01-24 MED ORDER — METOPROLOL SUCCINATE ER 50 MG PO TB24: ORAL_TABLET | ORAL | 3 refills | Status: DC

## 2020-02-12 ENCOUNTER — Other Ambulatory Visit: Payer: Self-pay | Admitting: Physician Assistant

## 2020-02-15 ENCOUNTER — Other Ambulatory Visit: Payer: Self-pay | Admitting: Cardiology

## 2020-02-16 ENCOUNTER — Other Ambulatory Visit: Payer: Self-pay | Admitting: Internal Medicine

## 2020-04-01 ENCOUNTER — Ambulatory Visit (INDEPENDENT_AMBULATORY_CARE_PROVIDER_SITE_OTHER): Payer: Medicare Other

## 2020-04-01 DIAGNOSIS — I495 Sick sinus syndrome: Secondary | ICD-10-CM

## 2020-04-01 LAB — CUP PACEART REMOTE DEVICE CHECK
Battery Remaining Longevity: 115 mo
Battery Remaining Percentage: 95.5 %
Battery Voltage: 2.99 V
Brady Statistic AP VP Percent: 1 %
Brady Statistic AP VS Percent: 99 %
Brady Statistic AS VP Percent: 1 %
Brady Statistic AS VS Percent: 1 %
Brady Statistic RA Percent Paced: 99 %
Brady Statistic RV Percent Paced: 1 %
Date Time Interrogation Session: 20211026020021
Implantable Lead Implant Date: 20050901
Implantable Lead Implant Date: 20050901
Implantable Lead Location: 753859
Implantable Lead Location: 753860
Implantable Pulse Generator Implant Date: 20170823
Lead Channel Impedance Value: 490 Ohm
Lead Channel Impedance Value: 530 Ohm
Lead Channel Pacing Threshold Amplitude: 0.5 V
Lead Channel Pacing Threshold Amplitude: 1.25 V
Lead Channel Pacing Threshold Pulse Width: 0.5 ms
Lead Channel Pacing Threshold Pulse Width: 0.5 ms
Lead Channel Sensing Intrinsic Amplitude: 12 mV
Lead Channel Sensing Intrinsic Amplitude: 2 mV
Lead Channel Setting Pacing Amplitude: 2 V
Lead Channel Setting Pacing Amplitude: 2.5 V
Lead Channel Setting Pacing Pulse Width: 0.5 ms
Lead Channel Setting Sensing Sensitivity: 2 mV
Pulse Gen Model: 2272
Pulse Gen Serial Number: 7942041

## 2020-04-07 NOTE — Progress Notes (Signed)
Remote pacemaker transmission.   

## 2020-07-01 ENCOUNTER — Ambulatory Visit (INDEPENDENT_AMBULATORY_CARE_PROVIDER_SITE_OTHER): Payer: Medicare Other

## 2020-07-01 DIAGNOSIS — I495 Sick sinus syndrome: Secondary | ICD-10-CM | POA: Diagnosis not present

## 2020-07-01 LAB — CUP PACEART REMOTE DEVICE CHECK
Battery Remaining Longevity: 116 mo
Battery Remaining Percentage: 95.5 %
Battery Voltage: 2.99 V
Brady Statistic AP VP Percent: 1 %
Brady Statistic AP VS Percent: 99 %
Brady Statistic AS VP Percent: 1 %
Brady Statistic AS VS Percent: 1 %
Brady Statistic RA Percent Paced: 99 %
Brady Statistic RV Percent Paced: 1 %
Date Time Interrogation Session: 20220125020011
Implantable Lead Implant Date: 20050901
Implantable Lead Implant Date: 20050901
Implantable Lead Location: 753859
Implantable Lead Location: 753860
Implantable Pulse Generator Implant Date: 20170823
Lead Channel Impedance Value: 510 Ohm
Lead Channel Impedance Value: 560 Ohm
Lead Channel Pacing Threshold Amplitude: 0.5 V
Lead Channel Pacing Threshold Amplitude: 1.25 V
Lead Channel Pacing Threshold Pulse Width: 0.5 ms
Lead Channel Pacing Threshold Pulse Width: 0.5 ms
Lead Channel Sensing Intrinsic Amplitude: 2.1 mV
Lead Channel Sensing Intrinsic Amplitude: 9 mV
Lead Channel Setting Pacing Amplitude: 2 V
Lead Channel Setting Pacing Amplitude: 2.5 V
Lead Channel Setting Pacing Pulse Width: 0.5 ms
Lead Channel Setting Sensing Sensitivity: 2 mV
Pulse Gen Model: 2272
Pulse Gen Serial Number: 7942041

## 2020-07-12 NOTE — Progress Notes (Signed)
Remote pacemaker transmission.   

## 2020-08-06 ENCOUNTER — Other Ambulatory Visit: Payer: Self-pay | Admitting: Internal Medicine

## 2020-09-30 ENCOUNTER — Ambulatory Visit (INDEPENDENT_AMBULATORY_CARE_PROVIDER_SITE_OTHER): Payer: Medicare Other

## 2020-09-30 DIAGNOSIS — I495 Sick sinus syndrome: Secondary | ICD-10-CM | POA: Diagnosis not present

## 2020-09-30 LAB — CUP PACEART REMOTE DEVICE CHECK
Battery Remaining Longevity: 115 mo
Battery Remaining Percentage: 95.5 %
Battery Voltage: 2.99 V
Brady Statistic AP VP Percent: 1 %
Brady Statistic AP VS Percent: 99 %
Brady Statistic AS VP Percent: 1 %
Brady Statistic AS VS Percent: 1 %
Brady Statistic RA Percent Paced: 99 %
Brady Statistic RV Percent Paced: 1 %
Date Time Interrogation Session: 20220426020013
Implantable Lead Implant Date: 20050901
Implantable Lead Implant Date: 20050901
Implantable Lead Location: 753859
Implantable Lead Location: 753860
Implantable Pulse Generator Implant Date: 20170823
Lead Channel Impedance Value: 510 Ohm
Lead Channel Impedance Value: 510 Ohm
Lead Channel Pacing Threshold Amplitude: 0.5 V
Lead Channel Pacing Threshold Amplitude: 1.25 V
Lead Channel Pacing Threshold Pulse Width: 0.5 ms
Lead Channel Pacing Threshold Pulse Width: 0.5 ms
Lead Channel Sensing Intrinsic Amplitude: 1.6 mV
Lead Channel Sensing Intrinsic Amplitude: 12 mV
Lead Channel Setting Pacing Amplitude: 2 V
Lead Channel Setting Pacing Amplitude: 2.5 V
Lead Channel Setting Pacing Pulse Width: 0.5 ms
Lead Channel Setting Sensing Sensitivity: 2 mV
Pulse Gen Model: 2272
Pulse Gen Serial Number: 7942041

## 2020-10-21 NOTE — Progress Notes (Signed)
Remote pacemaker transmission.   

## 2020-10-23 ENCOUNTER — Other Ambulatory Visit: Payer: Self-pay | Admitting: Internal Medicine

## 2020-11-03 ENCOUNTER — Other Ambulatory Visit: Payer: Self-pay | Admitting: Internal Medicine

## 2020-11-07 ENCOUNTER — Other Ambulatory Visit: Payer: Self-pay | Admitting: Internal Medicine

## 2020-12-05 ENCOUNTER — Other Ambulatory Visit: Payer: Self-pay | Admitting: Cardiology

## 2020-12-30 ENCOUNTER — Ambulatory Visit (INDEPENDENT_AMBULATORY_CARE_PROVIDER_SITE_OTHER): Payer: Medicare Other

## 2020-12-30 DIAGNOSIS — I495 Sick sinus syndrome: Secondary | ICD-10-CM | POA: Diagnosis not present

## 2020-12-30 LAB — CUP PACEART REMOTE DEVICE CHECK
Battery Remaining Longevity: 67 mo
Battery Remaining Percentage: 57 %
Battery Voltage: 2.99 V
Brady Statistic AP VP Percent: 1 %
Brady Statistic AP VS Percent: 99 %
Brady Statistic AS VP Percent: 1 %
Brady Statistic AS VS Percent: 1 %
Brady Statistic RA Percent Paced: 99 %
Brady Statistic RV Percent Paced: 1 %
Date Time Interrogation Session: 20220726020014
Implantable Lead Implant Date: 20050901
Implantable Lead Implant Date: 20050901
Implantable Lead Location: 753859
Implantable Lead Location: 753860
Implantable Pulse Generator Implant Date: 20170823
Lead Channel Impedance Value: 560 Ohm
Lead Channel Impedance Value: 610 Ohm
Lead Channel Pacing Threshold Amplitude: 0.5 V
Lead Channel Pacing Threshold Amplitude: 1.25 V
Lead Channel Pacing Threshold Pulse Width: 0.5 ms
Lead Channel Pacing Threshold Pulse Width: 0.5 ms
Lead Channel Sensing Intrinsic Amplitude: 12 mV
Lead Channel Sensing Intrinsic Amplitude: 2.1 mV
Lead Channel Setting Pacing Amplitude: 2 V
Lead Channel Setting Pacing Amplitude: 2.5 V
Lead Channel Setting Pacing Pulse Width: 0.5 ms
Lead Channel Setting Sensing Sensitivity: 2 mV
Pulse Gen Model: 2272
Pulse Gen Serial Number: 7942041

## 2021-01-05 ENCOUNTER — Telehealth: Payer: Self-pay | Admitting: Internal Medicine

## 2021-01-05 NOTE — Telephone Encounter (Signed)
*  STAT* If patient is at the pharmacy, call can be transferred to refill team.   1. Which medications need to be refilled? (please list name of each medication and dose if known)  amLODipine (NORVASC) 2.5 MG tablet   2. Which pharmacy/location (including street and city if local pharmacy) is medication to be sent to? [address], [place], [place] [address]   3. Do they need a 30 day or 90 day supply?  90 day supply  Patient's daughter states the patient has been completely out of medication for 2 days.

## 2021-01-07 NOTE — Telephone Encounter (Signed)
Left a message for [place], [place] on file, that a 90 day refill for Amlodipine was sent in 12/05/2020.

## 2021-01-26 ENCOUNTER — Other Ambulatory Visit: Payer: Self-pay | Admitting: Internal Medicine

## 2021-01-26 NOTE — Progress Notes (Signed)
Remote pacemaker transmission.   

## 2021-03-23 ENCOUNTER — Telehealth: Payer: Self-pay | Admitting: Internal Medicine

## 2021-03-23 NOTE — Telephone Encounter (Signed)
I released the patient in [place]. I marked her inactive in paceart.

## 2021-03-23 NOTE — Telephone Encounter (Signed)
Spoke with pt's daughter, Abigail Butts, [place] who reports pt has moved to [place] to live with her other daughter.  She has establish care with a cardiologist for PPM monitoring.  Pt and family wishes to thank Dr Caryl Comes for his assistance through the years.

## 2021-03-23 NOTE — Telephone Encounter (Signed)
New Message:      Daughter called and wanted Dr Caryl Comes to know that the pt have moved away. She wanted to thank him for all that he had did for her through the years.
